# Patient Record
Sex: Female | Born: 1985 | Race: Asian | Hispanic: No | Marital: Married | Smoking: Never smoker
Health system: Southern US, Community
[De-identification: ages and names within clinical notes are randomized; demographics above are authoritative.]

## PROBLEM LIST (undated history)

## (undated) DIAGNOSIS — Z789 Other specified health status: Secondary | ICD-10-CM

## (undated) DIAGNOSIS — E079 Disorder of thyroid, unspecified: Secondary | ICD-10-CM

## (undated) DIAGNOSIS — G43109 Migraine with aura, not intractable, without status migrainosus: Secondary | ICD-10-CM

## (undated) DIAGNOSIS — E669 Obesity, unspecified: Secondary | ICD-10-CM

## (undated) HISTORY — DX: Other specified health status: Z78.9

## (undated) HISTORY — DX: Migraine with aura, not intractable, without status migrainosus: G43.109

## (undated) HISTORY — DX: Obesity, unspecified: E66.9

---

## 2015-11-02 DIAGNOSIS — E049 Nontoxic goiter, unspecified: Secondary | ICD-10-CM | POA: Insufficient documentation

## 2015-11-02 DIAGNOSIS — N941 Unspecified dyspareunia: Secondary | ICD-10-CM | POA: Insufficient documentation

## 2015-11-02 DIAGNOSIS — E040 Nontoxic diffuse goiter: Secondary | ICD-10-CM | POA: Insufficient documentation

## 2015-11-02 LAB — HM PAP SMEAR

## 2017-08-10 ENCOUNTER — Ambulatory Visit (INDEPENDENT_AMBULATORY_CARE_PROVIDER_SITE_OTHER): Payer: Managed Care, Other (non HMO) | Admitting: Maternal Newborn

## 2017-08-10 ENCOUNTER — Encounter: Payer: Self-pay | Admitting: Maternal Newborn

## 2017-08-10 VITALS — BP 100/62 | Ht 64.0 in | Wt 194.0 lb

## 2017-08-10 DIAGNOSIS — O0990 Supervision of high risk pregnancy, unspecified, unspecified trimester: Secondary | ICD-10-CM | POA: Insufficient documentation

## 2017-08-10 DIAGNOSIS — E039 Hypothyroidism, unspecified: Secondary | ICD-10-CM | POA: Insufficient documentation

## 2017-08-10 DIAGNOSIS — O0991 Supervision of high risk pregnancy, unspecified, first trimester: Secondary | ICD-10-CM

## 2017-08-10 DIAGNOSIS — E669 Obesity, unspecified: Secondary | ICD-10-CM | POA: Insufficient documentation

## 2017-08-10 DIAGNOSIS — G43009 Migraine without aura, not intractable, without status migrainosus: Secondary | ICD-10-CM

## 2017-08-10 DIAGNOSIS — G43109 Migraine with aura, not intractable, without status migrainosus: Secondary | ICD-10-CM | POA: Insufficient documentation

## 2017-08-10 NOTE — Progress Notes (Signed)
08/10/2017   Chief Complaint: Positive home pregnancy test, desires prenatal care.  Transfer of Care Patient: no  History of Present Illness: Ms. Zettlemoyer is a 31 y.o. G1P0 with an EDD of 03/14/2018 based on Patient's last menstrual period was 06/07/2017 (exact date) with the above CC.   Her periods were: regular periods every 32-33 days, lasting 4 days. She was using no method when she conceived.  She has Positive signs or symptoms of nausea of pregnancy. She has Negative signs or symptoms of miscarriage or preterm labor She identifies Negative Zika risk factors for her and her partner On any different medications around the time she conceived/early pregnancy: No  History of varicella: Yes   ROS: A 12-point review of systems was performed and negative, except as stated in the above HPI.  OBGYN History: As per HPI. OB History  Gravida Para Term Preterm AB Living  1            SAB TAB Ectopic Multiple Live Births               # Outcome Date GA Lbr Len/2nd Weight Sex Delivery Anes PTL Lv  1 Current              Any issues with any prior pregnancies: not applicable. Any prior children are healthy, doing well, without any problems or issues: not applicable. History of pap smears: Yes. Last pap smear 11/02/2015, NILM/HPV Negative.  History of STIs: No.   Past Medical History: No past medical history on file.  Past Surgical History: No past surgical history on file.  Family History:  Family History  Problem Relation Age of Onset  . Leukemia Maternal Grandfather    She denies any female cancers, bleeding or blood clotting disorders.  She denies any history of intellectual disability, birth defects or genetic disorders in her or the FOB's history  Social History:  Social History   Social History  . Marital status: Married    Spouse name: N/A  . Number of children: N/A  . Years of education: N/A   Occupational History  . Not on file.   Social History Main Topics  .  Smoking status: Never Smoker  . Smokeless tobacco: Never Used  . Alcohol use No  . Drug use: No  . Sexual activity: Yes    Birth control/ protection: None   Other Topics Concern  . Not on file   Social History Narrative  . No narrative on file   Any cats in the household: no. Denies exposure to TB.  Allergy: No Known Allergies  Current Outpatient Medications: No current outpatient prescriptions on file.  .. Physical Exam:   BP 100/62   Ht  (1.626 m)   Wt 194 lb (88 kg)   LMP 06/07/2017 (Exact Date)   BMI 33.30 kg/m  Body mass index is 33.3 kg/m. Constitutional: Well nourished, well developed female in no acute distress.  Neck:  Supple, normal appearance, and no thyromegaly  Cardiovascular: S1, S2 normal, no murmur, rub or gallop, regular rate and rhythm Respiratory:  Clear to auscultation bilateral. Normal respiratory effort Abdomen: positive bowel sounds and no masses, hernias; diffusely non tender to palpation, non distended Breasts: breasts appear normal, no suspicious masses, no skin or nipple changes or axillary nodes. Neuro/Psych:  Normal mood and affect.  Skin:  Warm and dry.  Lymphatic:  No inguinal lymphadenopathy.   Pelvic exam: is not limited by body habitus, but patient experiences pelvic exam as very  painful and difficult to tolerate. External genitalia, Bartholin's glands, Urethra, Skene's glands: within normal limits Vagina: within normal limits and with no blood in the vault  Cervix: normal appearing cervix without discharge or lesions, closed/long/high Uterus:  enlarged: consistent with pregnancy Adnexa:  normal adnexa  Assessment: Ms. Leblond is a 31 y.o. G1P0 Unknown based on Patient's last menstrual period was 06/07/2017 (exact date). with an Estimated Date of Delivery: None noted., presenting for prenatal care.  Plan:  1) Avoid alcoholic beverages. 2) Patient encouraged not to smoke.  3) Discontinue the use of all non-medicinal drugs  and chemicals.  4) Take prenatal vitamins daily.  5) Seatbelt use advised. 6) Nutrition, food safety (fish, cheese advisories, and high nitrite foods) and exercise discussed. 7) Hospital and practice style delivering at [facility] discussed.  8) Patient is asked about travel to areas at risk for the Zika virus, and counseled to avoid travel and exposure to mosquitoes or sexual partners who may have themselves been exposed to the virus.  9) Childbirth classes at [facility] advised. 10) Genetic Screening, such as with 1st Trimester Screening, cell free fetal DNA, AFP testing, and Ultrasound, as well as with amniocentesis and CVS as appropriate, is discussed with patient. She is undecided about genetic testing this pregnancy.  Needs GC next visit.  Problem list reviewed and updated.  Marcelyn Bruins, CNM [facility], [facility] 08/10/2017  11:48 AM

## 2017-08-10 NOTE — Patient Instructions (Signed)
First Trimester of Pregnancy The first trimester of pregnancy is from week 1 until the end of week 13 (months 1 through 3). A week after a sperm fertilizes an egg, the egg will implant on the wall of the uterus. This embryo will begin to develop into a baby. Genes from you and your partner will form the baby. The female genes will determine whether the baby will be a boy or a girl. At 6-8 weeks, the eyes and face will be formed, and the heartbeat can be seen on ultrasound. At the end of 12 weeks, all the baby's organs will be formed. Now that you are pregnant, you will want to do everything you can to have a healthy baby. Two of the most important things are to get good prenatal care and to follow your health care provider's instructions. Prenatal care is all the medical care you receive before the baby's birth. This care will help prevent, find, and treat any problems during the pregnancy and childbirth. Body changes during your first trimester Your body goes through many changes during pregnancy. The changes vary from woman to woman.  You may gain or lose a couple of pounds at first.  You may feel sick to your stomach (nauseous) and you may throw up (vomit). If the vomiting is uncontrollable, call your health care provider.  You may tire easily.  You may develop headaches that can be relieved by medicines. All medicines should be approved by your health care provider.  You may urinate more often. Painful urination may mean you have a bladder infection.  You may develop heartburn as a result of your pregnancy.  You may develop constipation because certain hormones are causing the muscles that push stool through your intestines to slow down.  You may develop hemorrhoids or swollen veins (varicose veins).  Your breasts may begin to grow larger and become tender. Your nipples may stick out more, and the tissue that surrounds them (areola) may become darker.  Your gums may bleed and may be  sensitive to brushing and flossing.  Dark spots or blotches (chloasma, mask of pregnancy) may develop on your face. This will likely fade after the baby is born.  Your menstrual periods will stop.  You may have a loss of appetite.  You may develop cravings for certain kinds of food.  You may have changes in your emotions from day to day, such as being excited to be pregnant or being concerned that something may go wrong with the pregnancy and baby.  You may have more vivid and strange dreams.  You may have changes in your hair. These can include thickening of your hair, rapid growth, and changes in texture. Some women also have hair loss during or after pregnancy, or hair that feels dry or thin. Your hair will most likely return to normal after your baby is born.  What to expect at prenatal visits During a routine prenatal visit:  You will be weighed to make sure you and the baby are growing normally.  Your blood pressure will be taken.  Your abdomen will be measured to track your baby's growth.  The fetal heartbeat will be listened to between weeks 10 and 14 of your pregnancy.  Test results from any previous visits will be discussed.  Your health care provider may ask you:  How you are feeling.  If you are feeling the baby move.  If you have had any abnormal symptoms, such as leaking fluid, bleeding, severe headaches,   or abdominal cramping.  If you are using any tobacco products, including cigarettes, chewing tobacco, and electronic cigarettes.  If you have any questions.  Other tests that may be performed during your first trimester include:  Blood tests to find your blood type and to check for the presence of any previous infections. The tests will also be used to check for low iron levels (anemia) and protein on red blood cells (Rh antibodies). Depending on your risk factors, or if you previously had diabetes during pregnancy, you may have tests to check for high blood  sugar that affects pregnant women (gestational diabetes).  Urine tests to check for infections, diabetes, or protein in the urine.  An ultrasound to confirm the proper growth and development of the baby.  Fetal screens for spinal cord problems (spina bifida) and Down syndrome.  HIV (human immunodeficiency virus) testing. Routine prenatal testing includes screening for HIV, unless you choose not to have this test.  You may need other tests to make sure you and the baby are doing well.  Follow these instructions at home: Medicines  Follow your health care provider's instructions regarding medicine use. Specific medicines may be either safe or unsafe to take during pregnancy.  Take a prenatal vitamin that contains at least 600 micrograms (mcg) of folic acid.  If you develop constipation, try taking a stool softener if your health care provider approves. Eating and drinking  Eat a balanced diet that includes fresh fruits and vegetables, whole grains, good sources of protein such as meat, eggs, or tofu, and low-fat dairy. Your health care provider will help you determine the amount of weight gain that is right for you.  Avoid raw meat and uncooked cheese. These carry germs that can cause birth defects in the baby.  Eating four or five small meals rather than three large meals a day may help relieve nausea and vomiting. If you start to feel nauseous, eating a few soda crackers can be helpful. Drinking liquids between meals, instead of during meals, also seems to help ease nausea and vomiting.  Limit foods that are high in fat and processed sugars, such as fried and sweet foods.  To prevent constipation: ? Eat foods that are high in fiber, such as fresh fruits and vegetables, whole grains, and beans. ? Drink enough fluid to keep your urine clear or pale yellow. Activity  Exercise only as directed by your health care provider. Most women can continue their usual exercise routine during  pregnancy. Try to exercise for 30 minutes at least 5 days a week. Exercising will help you: ? Control your weight. ? Stay in shape. ? Be prepared for labor and delivery.  Experiencing pain or cramping in the lower abdomen or lower back is a good sign that you should stop exercising. Check with your health care provider before continuing with normal exercises.  Try to avoid standing for long periods of time. Move your legs often if you must stand in one place for a long time.  Avoid heavy lifting.  Wear low-heeled shoes and practice good posture.  You may continue to have sex unless your health care provider tells you not to. Relieving pain and discomfort  Wear a good support bra to relieve breast tenderness.  Take warm sitz baths to soothe any pain or discomfort caused by hemorrhoids. Use hemorrhoid cream if your health care provider approves.  Rest with your legs elevated if you have leg cramps or low back pain.  If you develop   varicose veins in your legs, wear support hose. Elevate your feet for 15 minutes, 3-4 times a day. Limit salt in your diet. Prenatal care  Schedule your prenatal visits by the twelfth week of pregnancy. They are usually scheduled monthly at first, then more often in the last 2 months before delivery.  Write down your questions. Take them to your prenatal visits.  Keep all your prenatal visits as told by your health care provider. This is important. Safety  Wear your seat belt at all times when driving.  Make a list of emergency phone numbers, including numbers for family, friends, the hospital, and police and fire departments. General instructions  Ask your health care provider for a referral to a local prenatal education class. Begin classes no later than the beginning of month 6 of your pregnancy.  Ask for help if you have counseling or nutritional needs during pregnancy. Your health care provider can offer advice or refer you to specialists for help  with various needs.  Do not use hot tubs, steam rooms, or saunas.  Do not douche or use tampons or scented sanitary pads.  Do not cross your legs for long periods of time.  Avoid cat litter boxes and soil used by cats. These carry germs that can cause birth defects in the baby and possibly loss of the fetus by miscarriage or stillbirth.  Avoid all smoking, herbs, alcohol, and medicines not prescribed by your health care provider. Chemicals in these products affect the formation and growth of the baby.  Do not use any products that contain nicotine or tobacco, such as cigarettes and e-cigarettes. If you need help quitting, ask your health care provider. You may receive counseling support and other resources to help you quit.  Schedule a dentist appointment. At home, brush your teeth with a soft toothbrush and be gentle when you floss. Contact a health care provider if:  You have dizziness.  You have mild pelvic cramps, pelvic pressure, or nagging pain in the abdominal area.  You have persistent nausea, vomiting, or diarrhea.  You have a bad smelling vaginal discharge.  You have pain when you urinate.  You notice increased swelling in your face, hands, legs, or ankles.  You are exposed to fifth disease or chickenpox.  You are exposed to German measles (rubella) and have never had it. Get help right away if:  You have a fever.  You are leaking fluid from your vagina.  You have spotting or bleeding from your vagina.  You have severe abdominal cramping or pain.  You have rapid weight gain or loss.  You vomit blood or material that looks like coffee grounds.  You develop a severe headache.  You have shortness of breath.  You have any kind of trauma, such as from a fall or a car accident. Summary  The first trimester of pregnancy is from week 1 until the end of week 13 (months 1 through 3).  Your body goes through many changes during pregnancy. The changes vary from  woman to woman.  You will have routine prenatal visits. During those visits, your health care provider will examine you, discuss any test results you may have, and talk with you about how you are feeling. This information is not intended to replace advice given to you by your health care provider. Make sure you discuss any questions you have with your health care provider. Document Released: 10/24/2001 Document Revised: 10/11/2016 Document Reviewed: 10/11/2016 Elsevier Interactive Patient Education  2017 Elsevier   Inc.  

## 2017-08-11 LAB — RPR+RH+ABO+RUB AB+AB SCR+CB...
Antibody Screen: NEGATIVE
HEMATOCRIT: 37.4 % (ref 34.0–46.6)
HEP B S AG: NEGATIVE
HIV SCREEN 4TH GENERATION: NONREACTIVE
Hemoglobin: 12.2 g/dL (ref 11.1–15.9)
MCH: 27.2 pg (ref 26.6–33.0)
MCHC: 32.6 g/dL (ref 31.5–35.7)
MCV: 84 fL (ref 79–97)
PLATELETS: 329 10*3/uL (ref 150–379)
RBC: 4.48 x10E6/uL (ref 3.77–5.28)
RDW: 14.1 % (ref 12.3–15.4)
RH TYPE: POSITIVE
RPR: NONREACTIVE
RUBELLA: 17.9 {index} (ref >0.99)
Varicella zoster IgG: 1114 index (ref >165)
WBC: 8.1 10*3/uL (ref 3.4–10.8)

## 2017-08-12 LAB — URINE DRUG PANEL 7
Amphetamines, Urine: NEGATIVE ng/mL
Barbiturate Quant, Ur: NEGATIVE ng/mL
Benzodiazepine Quant, Ur: NEGATIVE ng/mL
CANNABINOID QUANT UR: NEGATIVE ng/mL
COCAINE (METAB.): NEGATIVE ng/mL
OPIATE QUANT UR: NEGATIVE ng/mL
PCP Quant, Ur: NEGATIVE ng/mL

## 2017-08-12 LAB — URINE CULTURE

## 2017-08-14 ENCOUNTER — Encounter: Payer: Self-pay | Admitting: Maternal Newborn

## 2017-08-14 DIAGNOSIS — E559 Vitamin D deficiency, unspecified: Secondary | ICD-10-CM | POA: Insufficient documentation

## 2017-08-14 DIAGNOSIS — D649 Anemia, unspecified: Secondary | ICD-10-CM | POA: Insufficient documentation

## 2017-08-21 ENCOUNTER — Ambulatory Visit (INDEPENDENT_AMBULATORY_CARE_PROVIDER_SITE_OTHER): Payer: Managed Care, Other (non HMO)

## 2017-08-21 ENCOUNTER — Ambulatory Visit (INDEPENDENT_AMBULATORY_CARE_PROVIDER_SITE_OTHER): Payer: Managed Care, Other (non HMO) | Admitting: Obstetrics & Gynecology

## 2017-08-21 VITALS — BP 100/70 | Wt 193.0 lb

## 2017-08-21 DIAGNOSIS — Z362 Encounter for other antenatal screening follow-up: Secondary | ICD-10-CM

## 2017-08-21 DIAGNOSIS — O0991 Supervision of high risk pregnancy, unspecified, first trimester: Secondary | ICD-10-CM

## 2017-08-21 DIAGNOSIS — Z3A10 10 weeks gestation of pregnancy: Secondary | ICD-10-CM

## 2017-08-21 DIAGNOSIS — E669 Obesity, unspecified: Secondary | ICD-10-CM

## 2017-08-21 NOTE — Progress Notes (Signed)
Review of ULTRASOUND.    I have personally reviewed images and report of recent ultrasound done at [facility].    Plan of management to be discussed with patient. EDC remains the same. Genetic testing discussed, undecided. GC of urine today or nv if unable to procure specimen

## 2017-08-22 ENCOUNTER — Encounter: Payer: Self-pay | Admitting: Obstetrics & Gynecology

## 2017-08-29 ENCOUNTER — Other Ambulatory Visit: Payer: Self-pay | Admitting: Obstetrics & Gynecology

## 2017-08-29 DIAGNOSIS — Q909 Down syndrome, unspecified: Secondary | ICD-10-CM

## 2017-08-29 DIAGNOSIS — Z1371 Encounter for nonprocreative screening for genetic disease carrier status: Secondary | ICD-10-CM

## 2017-08-29 NOTE — Telephone Encounter (Signed)
Order in for First screen ultrasound, pt needs to have scheduled soon, with visit CNM same day w labs

## 2017-09-03 ENCOUNTER — Telehealth: Payer: Self-pay | Admitting: Obstetrics & Gynecology

## 2017-09-03 NOTE — Telephone Encounter (Signed)
Called and left voicemail for patient to schedule appt.

## 2017-09-03 NOTE — Telephone Encounter (Signed)
Shelly Coleman, Shelly P, MD  to Ws-Admin     10:51 AM  Note    Order in for First screen ultrasound, pt needs to have scheduled soon, with visit CNM same day w labs    Shelly Coleman, Shelly P, MD  to Shelly Coleman Lannen       10:51 AM  Appointment would entail an Ultrasound followed by lab work. Maybe an hour to hour and a half... Depends on how cooperative the baby is on ultrasound and hopefully no delays in the office.    Last read by Shelly Coleman Rua at 1:41 PM on 08/31/2017.        8:19 AM  Shelly Coleman, Shelly Coleman, CMA routed this conversation to Shelly Coleman, Shelly P, MD  August 28, 2017  Shelly Coleman Hallisey  to Shelly Mustardobert Coleman Harris, MD       3:29 PM  Thanks a lot for the response.... can you please tell me how much time will the whole process take, so that I can schedule it around my work schedule

## 2017-09-03 NOTE — Telephone Encounter (Signed)
Spoke with patient. Pt will send her husband to schedule appt

## 2017-09-10 ENCOUNTER — Encounter: Payer: Managed Care, Other (non HMO) | Admitting: Advanced Practice Midwife

## 2017-09-11 ENCOUNTER — Ambulatory Visit (INDEPENDENT_AMBULATORY_CARE_PROVIDER_SITE_OTHER): Payer: Managed Care, Other (non HMO)

## 2017-09-11 ENCOUNTER — Ambulatory Visit (INDEPENDENT_AMBULATORY_CARE_PROVIDER_SITE_OTHER): Payer: Managed Care, Other (non HMO) | Admitting: Advanced Practice Midwife

## 2017-09-11 VITALS — BP 120/80 | Wt 197.0 lb

## 2017-09-11 DIAGNOSIS — Z1371 Encounter for nonprocreative screening for genetic disease carrier status: Secondary | ICD-10-CM

## 2017-09-11 DIAGNOSIS — O0991 Supervision of high risk pregnancy, unspecified, first trimester: Secondary | ICD-10-CM

## 2017-09-11 DIAGNOSIS — Z3A13 13 weeks gestation of pregnancy: Secondary | ICD-10-CM

## 2017-09-11 DIAGNOSIS — Z113 Encounter for screening for infections with a predominantly sexual mode of transmission: Secondary | ICD-10-CM

## 2017-09-11 DIAGNOSIS — Z1379 Encounter for other screening for genetic and chromosomal anomalies: Secondary | ICD-10-CM

## 2017-09-11 NOTE — Patient Instructions (Signed)

## 2017-09-11 NOTE — Progress Notes (Signed)
  Routine Prenatal Care Visit  Subjective  Shelly Coleman is a 31 y.o. G1P0 at 8123w5d being seen today for ongoing prenatal care.  She is currently monitored for the following issues for this high-risk pregnancy and has Acquired hypothyroidism; Migraine with aura and without status migrainosus, not intractable; Obesity (BMI 30-39.9); Supervision of high risk pregnancy, antepartum, first trimester; Vitamin D deficiency, unspecified; Goiter diffuse; Dyspareunia, female; and Chronic anemia on her problem list.  ----------------------------------------------------------------------------------- Patient reports external genitalia bumps possibly ingrown hairs.   Denies leaking of fluid. Denies vaginal bleeding. Denies cramping. ----------------------------------------------------------------------------------- The following portions of the patient's history were reviewed and updated as appropriate: allergies, current medications, past family history, past medical history, past social history, past surgical history and problem list. Problem list updated.   Objective  Blood pressure 120/80, weight 197 lb (89.4 kg), last menstrual period 06/07/2017. Pregravid weight 190 lb (86.2 kg) Total Weight Gain 7 lb (3.175 kg) Urinalysis:      Fetal Status: NT scan/1st trimester screen today: NT 2.8 mm, CRL 81.0 mm, Nasal bone present  General:   Alert, oriented and cooperative. Patient is in no acute distress.  Skin: Skin is warm and dry. No rash noted.   Cardiovascular: Normal heart rate noted  Respiratory: Normal respiratory effort, no problems with respiration noted  Abdomen: Soft, gravid, appropriate for gestational age.       Pelvic:  Cervical exam deferred        Extremities: Normal range of motion.     Mental Status: Normal mood and affect. Normal behavior. Normal judgment and thought content.   Assessment   31 y.o. G1P0 at 323w5d by  03/14/2018, by Last Menstrual Period presenting for routine prenatal  visit  Plan   Pregnancy #1 Problems (from 08/10/17 to present)    Problem Noted Resolved   Supervision of high risk pregnancy, antepartum, first trimester 08/10/2017 by Oswaldo ConroySchmid, Jacelyn Y, CNM No   Overview Signed 08/10/2017  9:44 AM by Oswaldo ConroySchmid, Jacelyn Y, CNM    Clinic [facility] Prenatal Labs  Dating  Blood type:     Genetic Screen 1 Screen:    AFP:     Quad:     NIPS: Antibody:   Anatomic US  Rubella:   Varicella:    GTT Early:               Third trimester:  RPR:     Rhogam  HBsAg:     TDaP vaccine                       Flu Shot: HIV:     Baby Food                                GBS:   Contraception  Pap:  CBB     CS/VBAC NA   Support Person                  Preterm labor symptoms and general obstetric precautions including but not limited to vaginal bleeding, contractions, leaking of fluid and fetal movement were reviewed in detail with the patient. Please refer to After Visit Summary for other counseling recommendations.   Return in about 4 weeks (around 10/09/2017) for rob.  Tresea MallJane Tamaya Pun, CNM  09/11/2017 4:10 PM

## 2017-09-12 ENCOUNTER — Other Ambulatory Visit: Payer: Managed Care, Other (non HMO)

## 2017-09-12 ENCOUNTER — Encounter: Payer: Managed Care, Other (non HMO) | Admitting: Advanced Practice Midwife

## 2017-09-13 LAB — GC/CHLAMYDIA PROBE AMP
CHLAMYDIA, DNA PROBE: NEGATIVE
NEISSERIA GONORRHOEAE BY PCR: NEGATIVE

## 2017-09-13 LAB — FIRST TRIMESTER SCREEN W/NT
CRL: 81 mm
DIA MOM: 1.99
DIA Value: 356.5 pg/mL
GEST AGE-COLLECT: 13.4 wk
MATERNAL AGE AT EDD: 33 a
NUCHAL TRANSLUCENCY: 2.8 mm
NUMBER OF FETUSES: 1
Nuchal Translucency MoM: 1.42
PAPP-A MoM: 0.85
PAPP-A Value: 808.6 ng/mL
TEST RESULTS: POSITIVE — AB
Weight: 197 [lb_av]
hCG MoM: 1.79
hCG Value: 113.5 IU/mL

## 2017-09-17 ENCOUNTER — Telehealth: Payer: Self-pay

## 2017-09-17 NOTE — Telephone Encounter (Signed)
[facility] to be sure we had the abnl first trimester screen on pt.  [phone].  Called and adv we did have results.

## 2017-09-18 ENCOUNTER — Other Ambulatory Visit: Payer: Self-pay | Admitting: Advanced Practice Midwife

## 2017-09-18 ENCOUNTER — Encounter: Payer: Self-pay | Admitting: Advanced Practice Midwife

## 2017-09-18 DIAGNOSIS — O285 Abnormal chromosomal and genetic finding on antenatal screening of mother: Secondary | ICD-10-CM

## 2017-09-19 ENCOUNTER — Encounter: Payer: Self-pay | Admitting: Obstetrics & Gynecology

## 2017-09-19 ENCOUNTER — Encounter: Payer: Managed Care, Other (non HMO) | Admitting: Obstetrics and Gynecology

## 2017-09-19 ENCOUNTER — Telehealth: Payer: Self-pay | Admitting: Advanced Practice Midwife

## 2017-09-19 ENCOUNTER — Other Ambulatory Visit: Payer: Managed Care, Other (non HMO)

## 2017-09-19 DIAGNOSIS — O285 Abnormal chromosomal and genetic finding on antenatal screening of mother: Secondary | ICD-10-CM

## 2017-09-19 NOTE — Telephone Encounter (Signed)
Patient and husband would like call with genetic testing results.  Shelly Coleman had called and left generic msg and they are wanting to talk to someone who could explain results and answer any questions.  Husband would like the cb, he is on privacy policy.  CB [phone].

## 2017-09-20 NOTE — Telephone Encounter (Signed)
Spoke with patient's husband. He says they want to find out gender together. I will write a note and leave it at the front desk for them when the results come back.

## 2017-09-25 LAB — INFORMASEQ(SM) WITH XY ANALYSIS
FETAL FRACTION (%): 6.3
GESTATIONAL AGE AT COLLECTION: 14.9 wk
Weight: 201 [lb_av]

## 2017-09-26 ENCOUNTER — Encounter: Payer: Self-pay | Admitting: Advanced Practice Midwife

## 2017-09-26 NOTE — Telephone Encounter (Signed)
Please advise 

## 2017-10-09 ENCOUNTER — Ambulatory Visit (INDEPENDENT_AMBULATORY_CARE_PROVIDER_SITE_OTHER): Payer: Managed Care, Other (non HMO) | Admitting: Obstetrics and Gynecology

## 2017-10-09 VITALS — BP 104/64 | Wt 200.0 lb

## 2017-10-09 DIAGNOSIS — Z23 Encounter for immunization: Secondary | ICD-10-CM | POA: Diagnosis not present

## 2017-10-09 DIAGNOSIS — O0990 Supervision of high risk pregnancy, unspecified, unspecified trimester: Secondary | ICD-10-CM

## 2017-10-09 DIAGNOSIS — O0992 Supervision of high risk pregnancy, unspecified, second trimester: Secondary | ICD-10-CM

## 2017-10-09 DIAGNOSIS — Z3A17 17 weeks gestation of pregnancy: Secondary | ICD-10-CM

## 2017-10-09 DIAGNOSIS — E039 Hypothyroidism, unspecified: Secondary | ICD-10-CM

## 2017-10-09 NOTE — Progress Notes (Signed)
Pt reports no problems.   

## 2017-10-09 NOTE — Progress Notes (Signed)
     Routine Prenatal Care Visit  Subjective  Shelly Coleman is a 31 y.o. G1P0 at 3832w6d being seen today for ongoing prenatal care.  She is currently monitored for the following issues for this high-risk pregnancy and has Acquired hypothyroidism; Migraine with aura and without status migrainosus, not intractable; Obesity (BMI 30-39.9); Supervision of high risk pregnancy, antepartum; Vitamin D deficiency, unspecified; Goiter diffuse; Dyspareunia, female; and Chronic anemia on their problem list.  ----------------------------------------------------------------------------------- Patient reports no complaints.    Contractions: Not present. Vag. Bleeding: None.  Movement: Absent. Denies leaking of fluid.     Objective   Vitals:   10/09/17 0836  BP: 104/64  Weight: 200 lb (90.7 kg)   Pregravid Weight: 190 lb (86.2 kg)  Total Weight Gain: 10 lb (4.536 kg)  Urinalysis: Urine Protein: Negative Urine Glucose: Negative  Fetal Status: Fetal Heart Rate (bpm): 145   Movement: Absent     General: Alert: Oriented and cooperative. Patient is in no acute distress. Skin: Skin is warm and dry. No rash noted.  Cardiovascular: Regular rate and rhythm. No murmurs, gallops, or rubs. Respiratory: Normal respiratory effort, no problems with respiration noted Abdomen: Soft, gravid, appropriate for gestational age. Pain/Pressure: Absent Pelvic: Cervical exam deferred       Extremeties: Normal range of motion.    Mental Status: Normal mood and affect. Normal behavior. Normal judgment and thought content.  Assessment   31 y.o. G1P0 at 5632w6d by  03/14/2018, by Last Menstrual Period presenting for routine prenatal visit  Plan   Pregnancy #1 Problems (from 08/10/17 to present)    Problem Noted Resolved   Supervision of high risk pregnancy, antepartum 08/10/2017 by Oswaldo ConroySchmid, Jacelyn Y, CNM No   Overview Addendum 10/09/2017 10:32 AM by Natale MilchSchuman, Gilmore List R, MD      Clinic [facility] Prenatal [facility]  Dating   LMP=T1US Blood type: O/Positive/-- (09/28 1001)   Genetic Screen NIPS:   Normal XY female Antibody:Negative (09/28 1001)  Anatomic US  Rubella: 17.90 (09/28 1001) Varicella: Immune  GTT Early:        28 wk:      RPR: Non Reactive (09/28 1001)   Rhogam  Non indicated HBsAg: Negative (09/28 1001)   TDaP vaccine                       HIV:   Negative (08/10/17)  Flu Shot   10/09/2017                            GBS:   Contraception  Pap:  CBB     CS/VBAC    Baby Food    Support Person                 Preterm labor symptoms and general obstetric precautions including but not limited to vaginal bleeding, contractions, leaking of fluid and fetal movement were reviewed in detail with the patient.  Hypothyroidism being managed by her endocrinologist. She is having monthly labs done.  Flu Shot today.   Return in about 2 weeks (around 10/23/2017) for Anatomy US and ROB.  Adelene Idlerhristanna Danniell Rotundo M.D. 10/10/2017

## 2017-10-17 ENCOUNTER — Encounter: Payer: Self-pay | Admitting: Advanced Practice Midwife

## 2017-10-23 ENCOUNTER — Ambulatory Visit (INDEPENDENT_AMBULATORY_CARE_PROVIDER_SITE_OTHER): Payer: Managed Care, Other (non HMO) | Admitting: Certified Nurse Midwife

## 2017-10-23 ENCOUNTER — Ambulatory Visit (INDEPENDENT_AMBULATORY_CARE_PROVIDER_SITE_OTHER): Payer: Managed Care, Other (non HMO)

## 2017-10-23 VITALS — BP 110/70 | Wt 202.0 lb

## 2017-10-23 DIAGNOSIS — O285 Abnormal chromosomal and genetic finding on antenatal screening of mother: Secondary | ICD-10-CM | POA: Insufficient documentation

## 2017-10-23 DIAGNOSIS — O0990 Supervision of high risk pregnancy, unspecified, unspecified trimester: Secondary | ICD-10-CM

## 2017-10-23 DIAGNOSIS — Z3A19 19 weeks gestation of pregnancy: Secondary | ICD-10-CM

## 2017-10-23 DIAGNOSIS — O0992 Supervision of high risk pregnancy, unspecified, second trimester: Secondary | ICD-10-CM | POA: Diagnosis not present

## 2017-10-23 DIAGNOSIS — Z789 Other specified health status: Secondary | ICD-10-CM | POA: Insufficient documentation

## 2017-10-23 NOTE — Progress Notes (Signed)
Pt c/o head cold since Friday 12/7, taking claritin. Temp today 97.7. Anatomy scan today.

## 2017-10-23 NOTE — Progress Notes (Signed)
ROB and anatomy scan at 19wk5d.  Anatomy scan normal but incomplete for profile, right outflow tract and profile Anterior placenta, +quickening Has cold sx-taking Claritin with relief of rhinorrhea. Safe OTC meds discussed for cold sx and heartburn Some momentary SOB-probably normal for pregnancy, no CP or wheezing. Lungs CTAB Multiple questions answered Has questions regarding coding on NIPT-coding was incorrect-will ask Felicia to look into this Had a positive screen for Down syndrome with first trimester test , but negative Informaseq ROB and FU anatomy scan in 4 weeks

## 2017-11-07 ENCOUNTER — Telehealth: Payer: Self-pay | Admitting: Certified Nurse Midwife

## 2017-11-07 NOTE — Telephone Encounter (Signed)
The dx I was given by Jill Sideolleen, or that I understood should have been used, is O28.5.  [facility] states that that is the code that was used and billed.  Is that code correct?

## 2017-11-08 NOTE — Telephone Encounter (Signed)
Had the right code, but this test was diagnostic and her part of the bill went to her deductible.

## 2017-11-08 NOTE — Telephone Encounter (Signed)
This problem is resolved

## 2017-11-28 ENCOUNTER — Ambulatory Visit (INDEPENDENT_AMBULATORY_CARE_PROVIDER_SITE_OTHER): Payer: Managed Care, Other (non HMO) | Admitting: Certified Nurse Midwife

## 2017-11-28 ENCOUNTER — Ambulatory Visit (INDEPENDENT_AMBULATORY_CARE_PROVIDER_SITE_OTHER): Payer: Managed Care, Other (non HMO)

## 2017-11-28 VITALS — BP 100/60 | Wt 206.0 lb

## 2017-11-28 DIAGNOSIS — Z3A24 24 weeks gestation of pregnancy: Secondary | ICD-10-CM

## 2017-11-28 DIAGNOSIS — Z131 Encounter for screening for diabetes mellitus: Secondary | ICD-10-CM

## 2017-11-28 DIAGNOSIS — O0990 Supervision of high risk pregnancy, unspecified, unspecified trimester: Secondary | ICD-10-CM

## 2017-11-28 DIAGNOSIS — Z362 Encounter for other antenatal screening follow-up: Secondary | ICD-10-CM

## 2017-11-28 NOTE — Progress Notes (Signed)
ROB and FU anatomy scan at 24wk6days Normal scan and normal growth today (52nd%), baby breech Feeling fetal movement. Has noticed more acanthosis nigricans around neck, more bilateral leg cramping at night. HAs been having some pain in her right thoracic back area after some supper meals x 2 mos,, sometimes 2-3 x/week. No nausea with pain, and it feels better with massage. No past history of gall bladder problems. Will get 1 hour Gtt in 3 weeks and ROB. Recommend Caltrate 600 Plus (with magnesium) at bedtime DIscussed available prenatal classes.  Shelly Coleman, CNM

## 2017-11-28 NOTE — Progress Notes (Signed)
Pt c/o leg cramps at night and pain below right shoulder in back after eating dinner. F/u u/s today for completion anatomy.

## 2017-12-19 ENCOUNTER — Other Ambulatory Visit: Payer: Managed Care, Other (non HMO)

## 2017-12-19 ENCOUNTER — Ambulatory Visit (INDEPENDENT_AMBULATORY_CARE_PROVIDER_SITE_OTHER): Payer: Managed Care, Other (non HMO) | Admitting: Certified Nurse Midwife

## 2017-12-19 VITALS — BP 108/64 | Wt 211.0 lb

## 2017-12-19 DIAGNOSIS — O0990 Supervision of high risk pregnancy, unspecified, unspecified trimester: Secondary | ICD-10-CM

## 2017-12-19 DIAGNOSIS — Z3A27 27 weeks gestation of pregnancy: Secondary | ICD-10-CM

## 2017-12-19 DIAGNOSIS — Z3A24 24 weeks gestation of pregnancy: Secondary | ICD-10-CM

## 2017-12-19 DIAGNOSIS — Z131 Encounter for screening for diabetes mellitus: Secondary | ICD-10-CM

## 2017-12-19 NOTE — Progress Notes (Signed)
Pt started taking magnesium for leg cramps at night and states it was helping for awhile but they have returned. 28 wk labs today.

## 2017-12-19 NOTE — Progress Notes (Signed)
ROB and 28 week labs at 27wk6d: Feeling good FM. Taking magnesium 250 mgm at night for leg cramps which has been helping prevent leg cramps. Having some pain over her pubic bone with some movement.   Signed up for prenatal classes Exam: no evidence of VV or inflammation in calves. Can use compression hose if desires. FH 30 and FHTs WNL ROB in 2 weeks.

## 2017-12-20 LAB — 28 WEEK RH+PANEL
BASOS ABS: 0 10*3/uL (ref 0.0–0.2)
Basos: 0 %
EOS (ABSOLUTE): 0.1 10*3/uL (ref 0.0–0.4)
Eos: 1 %
Gestational Diabetes Screen: 132 mg/dL (ref 65–139)
HEMATOCRIT: 33.7 % — AB (ref 34.0–46.6)
HEMOGLOBIN: 10.9 g/dL — AB (ref 11.1–15.9)
HIV Screen 4th Generation wRfx: NONREACTIVE
Immature Grans (Abs): 0 10*3/uL (ref 0.0–0.1)
Immature Granulocytes: 0 %
LYMPHS ABS: 1.8 10*3/uL (ref 0.7–3.1)
Lymphs: 22 %
MCH: 26.8 pg (ref 26.6–33.0)
MCHC: 32.3 g/dL (ref 31.5–35.7)
MCV: 83 fL (ref 79–97)
MONOS ABS: 0.4 10*3/uL (ref 0.1–0.9)
Monocytes: 4 %
NEUTROS ABS: 6.2 10*3/uL (ref 1.4–7.0)
Neutrophils: 73 %
PLATELETS: 277 10*3/uL (ref 150–379)
RBC: 4.07 x10E6/uL (ref 3.77–5.28)
RDW: 14.2 % (ref 12.3–15.4)
RPR Ser Ql: NONREACTIVE
WBC: 8.4 10*3/uL (ref 3.4–10.8)

## 2017-12-26 ENCOUNTER — Telehealth: Payer: Self-pay | Admitting: Certified Nurse Midwife

## 2017-12-26 NOTE — Telephone Encounter (Signed)
Patient called with results of labs. Recommend iron supplement for mild anemia. Follow up as scheduled.

## 2017-12-26 NOTE — Telephone Encounter (Signed)
Patient was called with lab results. Mild anemia. Recommend supplemental iron. Can take stool softener with iron.

## 2017-12-29 ENCOUNTER — Encounter: Payer: Self-pay | Admitting: Emergency Medicine

## 2017-12-29 ENCOUNTER — Emergency Department
Admission: EM | Admit: 2017-12-29 | Discharge: 2017-12-29 | Discharge status: Against Medical Advice | Payer: Self-pay | Attending: Emergency Medicine | Admitting: Emergency Medicine

## 2017-12-29 DIAGNOSIS — R600 Localized edema: Secondary | ICD-10-CM | POA: Insufficient documentation

## 2017-12-29 DIAGNOSIS — O9989 Other specified diseases and conditions complicating pregnancy, childbirth and the puerperium: Secondary | ICD-10-CM | POA: Insufficient documentation

## 2017-12-29 DIAGNOSIS — Z3A29 29 weeks gestation of pregnancy: Secondary | ICD-10-CM | POA: Insufficient documentation

## 2017-12-29 DIAGNOSIS — Z5321 Procedure and treatment not carried out due to patient leaving prior to being seen by health care provider: Secondary | ICD-10-CM | POA: Insufficient documentation

## 2017-12-29 HISTORY — DX: Disorder of thyroid, unspecified: E07.9

## 2017-12-29 NOTE — ED Notes (Signed)
Pt and visitor up to desk asking about wait time; man says pt has an appointment at urgent care in the AM but thought they might be seen here faster; they have decided to leave as pt says she is feeling better; will keep their urgent care appt in the am; encouraged to return for any urgent needs

## 2017-12-29 NOTE — ED Triage Notes (Signed)
Pt ambulatory to triage in NAD, reports swelling to left fourth finger x 2 days, today started draining pus.  Pt reports [redacted]wks pregnant.

## 2018-01-02 ENCOUNTER — Ambulatory Visit (INDEPENDENT_AMBULATORY_CARE_PROVIDER_SITE_OTHER): Payer: Managed Care, Other (non HMO) | Admitting: Obstetrics and Gynecology

## 2018-01-02 VITALS — BP 116/72 | Wt 214.0 lb

## 2018-01-02 DIAGNOSIS — E039 Hypothyroidism, unspecified: Secondary | ICD-10-CM

## 2018-01-02 DIAGNOSIS — O0990 Supervision of high risk pregnancy, unspecified, unspecified trimester: Secondary | ICD-10-CM

## 2018-01-02 DIAGNOSIS — O285 Abnormal chromosomal and genetic finding on antenatal screening of mother: Secondary | ICD-10-CM

## 2018-01-02 DIAGNOSIS — Z3A29 29 weeks gestation of pregnancy: Secondary | ICD-10-CM

## 2018-01-02 DIAGNOSIS — E669 Obesity, unspecified: Secondary | ICD-10-CM

## 2018-01-02 NOTE — Progress Notes (Signed)
ROB

## 2018-01-02 NOTE — Progress Notes (Signed)
MILK BLISTER

## 2018-01-06 ENCOUNTER — Encounter (INDEPENDENT_AMBULATORY_CARE_PROVIDER_SITE_OTHER): Payer: Self-pay

## 2018-01-17 ENCOUNTER — Ambulatory Visit (INDEPENDENT_AMBULATORY_CARE_PROVIDER_SITE_OTHER): Payer: Managed Care, Other (non HMO) | Admitting: Certified Nurse Midwife

## 2018-01-17 VITALS — BP 112/76 | Wt 217.0 lb

## 2018-01-17 DIAGNOSIS — Z3A32 32 weeks gestation of pregnancy: Secondary | ICD-10-CM

## 2018-01-17 DIAGNOSIS — O0990 Supervision of high risk pregnancy, unspecified, unspecified trimester: Secondary | ICD-10-CM

## 2018-01-17 DIAGNOSIS — Z23 Encounter for immunization: Secondary | ICD-10-CM | POA: Diagnosis not present

## 2018-01-17 NOTE — Progress Notes (Signed)
ROB at 32 weeks. Started prenatal classes. Wants to breast feed. Discussed contraception options. +FM. Discussed [facility] instructions. Questions regarding delivery answered Preterm labor precautions discussed. TDAP  Today. Signed BT consent. ROB in 2 weeks.  Farrel Connersolleen Shayne Deerman, CNM

## 2018-01-17 NOTE — Progress Notes (Signed)
Pt reports no problems. tdap and blood consent today.  

## 2018-01-25 ENCOUNTER — Encounter (INDEPENDENT_AMBULATORY_CARE_PROVIDER_SITE_OTHER): Payer: Self-pay

## 2018-01-28 ENCOUNTER — Other Ambulatory Visit: Payer: Self-pay | Admitting: Certified Nurse Midwife

## 2018-01-28 MED ORDER — BREAST PUMP MISC: 0 refills | Status: DC

## 2018-01-30 ENCOUNTER — Encounter: Payer: Self-pay | Admitting: Obstetrics and Gynecology

## 2018-01-30 ENCOUNTER — Ambulatory Visit (INDEPENDENT_AMBULATORY_CARE_PROVIDER_SITE_OTHER): Payer: Managed Care, Other (non HMO) | Admitting: Obstetrics and Gynecology

## 2018-01-30 VITALS — BP 104/64 | Wt 219.0 lb

## 2018-01-30 DIAGNOSIS — O26843 Uterine size-date discrepancy, third trimester: Secondary | ICD-10-CM

## 2018-01-30 DIAGNOSIS — Z789 Other specified health status: Secondary | ICD-10-CM

## 2018-01-30 DIAGNOSIS — O0990 Supervision of high risk pregnancy, unspecified, unspecified trimester: Secondary | ICD-10-CM

## 2018-01-30 DIAGNOSIS — O285 Abnormal chromosomal and genetic finding on antenatal screening of mother: Secondary | ICD-10-CM

## 2018-01-30 DIAGNOSIS — E559 Vitamin D deficiency, unspecified: Secondary | ICD-10-CM

## 2018-01-30 DIAGNOSIS — Z3A32 32 weeks gestation of pregnancy: Secondary | ICD-10-CM

## 2018-01-30 NOTE — Progress Notes (Signed)
Routine Prenatal Care Visit  Subjective  Shelly Coleman is a 32 y.o. G1P0 at [redacted]w[redacted]d being seen today for ongoing prenatal care.  She is currently monitored for the following issues for this high-risk pregnancy and has Acquired hypothyroidism; Migraine with aura and without status migrainosus, not intractable; Obesity (BMI 30-39.9); Supervision of high risk pregnancy, antepartum; Vitamin D deficiency, unspecified; Goiter diffuse; Dyspareunia, female; Chronic anemia; Abnormal screening blood test for Down syndrome in first trimester; and Vegetarian diet on their problem list.  ----------------------------------------------------------------------------------- Patient reports small swelling in her feet. Patient is having midline suprapubic pain and discomfort.  Contractions: Not present. Vag. Bleeding: None.  Movement: Present. Denies leaking of fluid.  ----------------------------------------------------------------------------------- The following portions of the patient's history were reviewed and updated as appropriate: allergies, current medications, past family history, past medical history, past social history, past surgical history and problem list. Problem list updated.   Objective  Blood pressure 104/64, weight 219 lb (99.3 kg), last menstrual period 06/07/2017. Pregravid weight 190 lb (86.2 kg) Total Weight Gain 29 lb (13.2 kg) Body mass index is 37.59 kg/m. Urinalysis: Urine Protein: Negative Urine Glucose: Negative  Fetal Status: Fetal Heart Rate (bpm): 120 Fundal Height: 35 cm Movement: Present     General:  Alert, oriented and cooperative. Patient is in no acute distress.  Skin: Skin is warm and dry. No rash noted.   Cardiovascular: Normal heart rate noted  Respiratory: Normal respiratory effort, no problems with respiration noted  Abdomen: Soft, gravid, appropriate for gestational age. Pain/Pressure: Present     Pelvic:  Cervical exam deferred        Extremities: Normal  range of motion.  Edema: Mild pitting, slight indentation  ental Status: Normal mood and affect. Normal behavior. Normal judgment and thought content.     Assessment   32 y.o. G1P0 at [redacted]w[redacted]d by  03/14/2018, by Last Menstrual Period presenting for routine prenatal visit  Plan   Pregnancy #1 Problems (from 08/10/17 to present)    Problem Noted Resolved   Supervision of high risk pregnancy, antepartum 08/10/2017 by Oswaldo Conroy, CNM No   Overview Addendum 01/30/2018  5:42 PM by Natale Milch, MD      [facility] Prenatal Labs  Dating  LMP=T1US Blood type: O/Positive/-- (09/28 1001)   Genetic Screen NIPS:   Normal XY female Antibody:Negative (09/28 1001)  Anatomic US WNL Rubella: 17.90 (09/28 1001) Varicella: Immune  GTT Early:   Not done     28 wk:  132   RPR: Non Reactive (09/28 1001)   Rhogam  Non indicated HBsAg: Negative (09/28 1001)   TDaP vaccine     01/17/2018                  HIV:   Negative (08/10/17)  Flu Shot   10/09/2017                            GBS:   Contraception LAM Pap: * Needs pap smear*  CBB     CS/VBAC  not applicable   Baby Food Breast   Support Person                 Gestational age appropriate obstetric precautions including but not limited to vaginal bleeding, contractions, leaking of fluid and fetal movement were reviewed in detail with the patient.    Suprapubic pain, discussed belly band for support. Patient declined physical therapy referral at this time  Feet swelling, advised compression stockings and elevating feet. Patient measuring large for gestational age, will obtain growth US. Advised patient adhere to low card healthy diet.  Return in about 1 week (around 02/06/2018) for ROB and US.  Adelene Idlerhristanna Rakhi Romagnoli MD  [facility], [facility] 01/30/2018, 5:45 PM

## 2018-02-05 ENCOUNTER — Encounter: Payer: Self-pay | Admitting: Certified Nurse Midwife

## 2018-02-12 ENCOUNTER — Ambulatory Visit (INDEPENDENT_AMBULATORY_CARE_PROVIDER_SITE_OTHER): Payer: Managed Care, Other (non HMO) | Admitting: Certified Nurse Midwife

## 2018-02-12 ENCOUNTER — Ambulatory Visit (INDEPENDENT_AMBULATORY_CARE_PROVIDER_SITE_OTHER): Payer: Managed Care, Other (non HMO)

## 2018-02-12 VITALS — BP 104/64 | Wt 222.0 lb

## 2018-02-12 DIAGNOSIS — Z3A35 35 weeks gestation of pregnancy: Secondary | ICD-10-CM

## 2018-02-12 DIAGNOSIS — O0990 Supervision of high risk pregnancy, unspecified, unspecified trimester: Secondary | ICD-10-CM

## 2018-02-12 NOTE — Progress Notes (Signed)
Pt reports no problems. States cyst/boil in vaginal area has healed. Growth u/s today.

## 2018-02-12 NOTE — Progress Notes (Signed)
HROB at 35wk5d for ROB and growth scan:  S: Had a sore bump on her bottom-last week, feels much better after moist heat  Baby active. Compression stockings have helped with edema  Wearing maternity support belt-helping pubic bone pain    O: Growth scan: EFW 53% (6#4oz), normal AFI 13.41 cm, cephalic presentation  FH 36 cm/ FHT 134  Exam: small healing folliculitis/boil on left buttock near perineum   A: IUP at 35wk5d weeks with normal growth  Folliculitis-resolving   P: ROB in 1 week. Needs GBS at that time  Breast and bottle feeding. Ordering breast pump  A

## 2018-02-18 ENCOUNTER — Encounter: Payer: Self-pay | Admitting: Certified Nurse Midwife

## 2018-02-21 ENCOUNTER — Ambulatory Visit (INDEPENDENT_AMBULATORY_CARE_PROVIDER_SITE_OTHER): Payer: Managed Care, Other (non HMO) | Admitting: Certified Nurse Midwife

## 2018-02-21 VITALS — BP 112/72 | Wt 225.0 lb

## 2018-02-21 DIAGNOSIS — Z3A37 37 weeks gestation of pregnancy: Secondary | ICD-10-CM

## 2018-02-21 DIAGNOSIS — O0990 Supervision of high risk pregnancy, unspecified, unspecified trimester: Secondary | ICD-10-CM

## 2018-02-21 DIAGNOSIS — Z3685 Encounter for antenatal screening for Streptococcus B: Secondary | ICD-10-CM

## 2018-02-21 MED ORDER — BREAST PUMP MISC: 0 refills | Status: DC

## 2018-02-21 NOTE — Progress Notes (Signed)
HROB at 37 weeks: Pubic bone discomfort when moving or turning over. Baby active. Trace edema lower extremities. Questions answered FH=38cm, FHTs WNL. GBS done. Labor precautions.

## 2018-02-21 NOTE — Progress Notes (Signed)
Pt c/o pressure in pubic bone area. Requests breast pump rx.

## 2018-02-23 LAB — STREP GP B NAA: STREP GROUP B AG: NEGATIVE

## 2018-02-27 ENCOUNTER — Ambulatory Visit (INDEPENDENT_AMBULATORY_CARE_PROVIDER_SITE_OTHER): Payer: Managed Care, Other (non HMO) | Admitting: Certified Nurse Midwife

## 2018-02-27 VITALS — BP 110/70 | Wt 226.0 lb

## 2018-02-27 DIAGNOSIS — E039 Hypothyroidism, unspecified: Secondary | ICD-10-CM

## 2018-02-27 DIAGNOSIS — O0990 Supervision of high risk pregnancy, unspecified, unspecified trimester: Secondary | ICD-10-CM

## 2018-02-27 DIAGNOSIS — Z3A37 37 weeks gestation of pregnancy: Secondary | ICD-10-CM

## 2018-02-27 NOTE — Progress Notes (Signed)
ROB at 37wk 6d: Denies contractions, vaginal bleeding, leakage of fluid. +FM Reviewed FKCs and labor precautions. Enc to preregister Discussed use of fiber laxatives to help with constipation ROB in 1 week  Farrel Connersolleen Paisley Grajeda, CNM

## 2018-02-27 NOTE — Progress Notes (Signed)
Pt c/o pain in pubic bone and difficulty walking. Desires cervical check.

## 2018-03-05 ENCOUNTER — Ambulatory Visit (INDEPENDENT_AMBULATORY_CARE_PROVIDER_SITE_OTHER): Payer: Managed Care, Other (non HMO) | Admitting: Certified Nurse Midwife

## 2018-03-05 VITALS — BP 118/76 | Temp 97.8°F | Wt 230.0 lb

## 2018-03-05 DIAGNOSIS — Z3A38 38 weeks gestation of pregnancy: Secondary | ICD-10-CM

## 2018-03-05 DIAGNOSIS — O0990 Supervision of high risk pregnancy, unspecified, unspecified trimester: Secondary | ICD-10-CM

## 2018-03-05 NOTE — Progress Notes (Signed)
Pt c/o feeling feverish and scratchy throat last night and this morning relieved by tylenol. No fever.

## 2018-03-07 ENCOUNTER — Encounter: Payer: Self-pay | Admitting: Certified Nurse Midwife

## 2018-03-07 NOTE — Progress Notes (Signed)
ROB at 38wk5d: Has some mild cold symptoms, discussed OTC and comfort measures. Baby active. Pelvic pressure but no regular contractions or vaginal bleeding. ROB in 1 week. Will do cervical exam at that time and schedule IOL Labor precautions  Farrel Connersolleen Latrisha Coiro, CNM

## 2018-03-12 ENCOUNTER — Encounter: Payer: Self-pay | Admitting: Certified Nurse Midwife

## 2018-03-13 ENCOUNTER — Ambulatory Visit (INDEPENDENT_AMBULATORY_CARE_PROVIDER_SITE_OTHER): Payer: Managed Care, Other (non HMO) | Admitting: Certified Nurse Midwife

## 2018-03-13 VITALS — BP 130/78 | Wt 231.0 lb

## 2018-03-13 DIAGNOSIS — O0990 Supervision of high risk pregnancy, unspecified, unspecified trimester: Secondary | ICD-10-CM

## 2018-03-13 DIAGNOSIS — L0232 Furuncle of buttock: Secondary | ICD-10-CM

## 2018-03-13 DIAGNOSIS — Z3A39 39 weeks gestation of pregnancy: Secondary | ICD-10-CM

## 2018-03-13 MED ORDER — CEPHALEXIN 500 MG PO CAPS: 500.0000 mg | ORAL_CAPSULE | Freq: Three times a day (TID) | ORAL | 2 refills | Status: DC

## 2018-03-13 MED ORDER — FLUCONAZOLE 150 MG PO TABS
ORAL_TABLET | ORAL | 3 refills | Status: DC
Start: 2018-03-13 — End: 2018-03-23

## 2018-03-13 NOTE — Progress Notes (Signed)
ROB at 39wk 6d: having some BH contractions and pelvic pressure and a blood tinged mucous discharge. +FM FH 40 cm and FHTs WNL Cervix: ?dilation, extremely posterior/ patulous ext os, and can't reach internal os/ -1/ ?50% Has a boil on her right buttock near perineum. RX for Keflex tid x 7 days Diflucan 150 mgm every 3 days x 2 doses, warm compresses Scheduled IOL for 8 May at 0500. Discussed induction methods, process. Labor precautions RTO on 6 May for BP check as blood pressure a little higher than usual and for FU on boil Farrel Conners, CNM

## 2018-03-13 NOTE — Progress Notes (Signed)
Pt reports no problems. Feels like she lost her mucous plug. Desires cervical check.

## 2018-03-18 ENCOUNTER — Ambulatory Visit (INDEPENDENT_AMBULATORY_CARE_PROVIDER_SITE_OTHER): Payer: Managed Care, Other (non HMO) | Admitting: Certified Nurse Midwife

## 2018-03-18 VITALS — BP 122/78 | Wt 230.0 lb

## 2018-03-18 DIAGNOSIS — Z3A40 40 weeks gestation of pregnancy: Secondary | ICD-10-CM

## 2018-03-18 DIAGNOSIS — O1213 Gestational proteinuria, third trimester: Secondary | ICD-10-CM

## 2018-03-18 DIAGNOSIS — O0990 Supervision of high risk pregnancy, unspecified, unspecified trimester: Secondary | ICD-10-CM

## 2018-03-18 NOTE — Progress Notes (Signed)
Pt reports braxton hicks ctx. Desires cervical check.

## 2018-03-19 ENCOUNTER — Encounter: Payer: Self-pay | Admitting: Certified Nurse Midwife

## 2018-03-19 LAB — PROTEIN / CREATININE RATIO, URINE
Creatinine, Urine: 160.5 mg/dL
PROTEIN/CREAT RATIO: 417 mg/g{creat} — AB (ref 0–200)
Protein, Ur: 67 mg/dL

## 2018-03-19 NOTE — H&P (Addendum)
OB History & Physical   History of Present Illness:  Chief Complaint:  Patient presents for scheduling IOL and blood pressure check. HPI:  Shelly Coleman is a 32 y.o. G1P0 female with EDC=03/14/2018 at [redacted]w[redacted]d dated by LMP=10wk ultrasound.  Her pregnancy has been complicated by hypothyroidism and currently takes 50 mcg of Synthroid daily. Her pregnancy was also remarkable for a positive screen for Down syndrome on her first trimester screen of 1:170. NIPT test showed diploid XY results.  She has gained 40# with her pregnancy and her BMI is currently 39.5 kg.m2. She had +1 proteinuria on dipstick today and a PC ratio was sent. Her blood pressure was 122/78 and she was asymptomatic of preeclampsia except for edema. She developed a boil on her right buttock and has been taking Keflex and applying warm compresses and it has been improving.    Has also had some spotting after cervical exams. Denies LOF. Prenatal care site: [facility]atal care at [facility] has also been remarkable for  [facility] Prenatal Labs  Dating  LMP=10 wk [place] Blood type: O/Positive/-- (09/28 1001)   Genetic Screen NIPS:   Normal XY female Antibody:Negative (09/28 1001)  Anatomic US WNL Rubella: 17.90 (09/28 1001) Varicella: Immune  GTT Early:   Not done     28 wk:  132   RPR: Non Reactive (09/28 1001)   Rhogam  Non indicated HBsAg: Negative (09/28 1001)   TDaP vaccine     01/17/2018                  HIV:   Negative (08/10/17)  Flu Shot   10/09/2017                            GBS: negative 4/11  Contraception LAM Pap: * Needs pap smear*  CBB     CS/VBAC  not applicable   Baby Food Breast   Support Person             Maternal Medical History:   Past Medical History:  Diagnosis Date  . Obesity (BMI 35.0-39.9 without comorbidity)   . Thyroid disease   . Vegetarian diet     Past Surgical History:  Procedure Laterality Date  . NO PAST SURGERIES      No Known Allergies  Prior to Admission medications   Medication Sig  Start Date End Date Taking? Authorizing Provider  cephALEXin (KEFLEX) 500 MG capsule Take 1 capsule (500 mg total) by mouth 3 (three) times daily. 03/13/18  Yes Farrel Conners, CNM  Cholecalciferol (VITAMIN D3) 5000 units CAPS Take 1 capsule by mouth daily.   Yes [provider]  ferrous sulfate 325 (65 FE) MG tablet Take by mouth.   Yes [provider]  fluconazole (DIFLUCAN) 150 MG tablet Take one tablet every three days x 2 doses 03/13/18  Yes Farrel Conners, CNM  levothyroxine (SYNTHROID, LEVOTHROID) 50 MCG tablet Take 50 mcg by mouth daily. Take two tablets (100 mcg) on Sundays. 03/30/17  Yes [provider]                Misc. Devices (BREAST PUMP) MISC Dispense one breast pump for patient 02/21/18  Yes Farrel Conners, CNM  Prenatal Vit-Fe Fumarate-FA (MULTIVITAMIN-PRENATAL) 27-0.8 MG TABS tablet Take 1 tablet by mouth daily at 12 noon.   Yes [provider]          Social History: She  reports that she has never smoked. She  has never used smokeless tobacco. She reports that she does not drink alcohol or use drugs.  Family History: family history includes Leukemia in her maternal grandfather.   Review of Systems: Negative x 10 systems reviewed except as noted in the HPI.      Physical Exam:  Vital Signs: BP 122/78   Wt 104.3 kg (230 lb)   LMP 06/07/2017 (Exact Date)   BMI 39.48 kg/m  General: gravid female no acute distress.  HEENT: normocephalic, atraumatic Heart: regular rate & rhythm.  No murmurs/rubs/gallops Lungs: clear to auscultation bilaterally Abdomen: soft, gravid, non-tender;  EFW: 8 1/2#, FHR 140 Pelvic:   External: Normal external female genitalia  Vagina: no masses  Cervix:1.5/50%/-1 Extremities: non-tender, symmetric, trace non pitting edema pretibially bilaterally.  DTRs: +2  Neurologic: Alert & oriented x 3.      Assessment:  Shelly Coleman is a 32 y.o. G1P0 female at [redacted]w[redacted]d in labor  Proteinuria with  normal blood pressure   Plan:  1. IOL scheduled for 5/8 2. CBC, T&S, CMP, PC ratio, Clrs, IVF 3. GBS negative.   4.  Discussed induction process and risks of hyperstimulation, fetal intolerance to labor, failure to progress, and Cesarean section. Probably will start IOL with Cytotec.  Farrel Conners, CNM

## 2018-03-19 NOTE — Progress Notes (Signed)
Shelly Coleman at 40wk4d: Had irregular contractions over the weekend. No headache, visual changes, RUQ pain. Baby active. Spotting x 2 days after last exam. Less pedal and ankle edema. Continues on Keflex for the boil on right buttock BP 122/78, but +1 protein. No dysuria. Urine appears concentrated IOL scheduled in 2 days at 0500 Cervix: 1.5/50%/-1/soft/posterior Probably will use Cytotec for induction +/- foley bulb Labor precautions PC ratio sent Farrel Conners, CNM

## 2018-03-20 ENCOUNTER — Inpatient Hospital Stay
Admission: EM | Admit: 2018-03-20 | Discharge: 2018-03-23 | DRG: 787 | Disposition: A | Discharge status: Home / Self Care | Payer: Managed Care, Other (non HMO) | Attending: Obstetrics and Gynecology | Admitting: Obstetrics and Gynecology

## 2018-03-20 ENCOUNTER — Inpatient Hospital Stay: Payer: Managed Care, Other (non HMO) | Admitting: Anesthesiology

## 2018-03-20 ENCOUNTER — Encounter
Admission: EM | Disposition: A | Discharge status: Home / Self Care | Payer: Self-pay | Source: Home / Self Care | Attending: Obstetrics and Gynecology

## 2018-03-20 ENCOUNTER — Other Ambulatory Visit: Payer: Self-pay

## 2018-03-20 DIAGNOSIS — O99214 Obesity complicating childbirth: Secondary | ICD-10-CM | POA: Diagnosis present

## 2018-03-20 DIAGNOSIS — Z3A40 40 weeks gestation of pregnancy: Secondary | ICD-10-CM

## 2018-03-20 DIAGNOSIS — E039 Hypothyroidism, unspecified: Secondary | ICD-10-CM | POA: Diagnosis present

## 2018-03-20 DIAGNOSIS — O9081 Anemia of the puerperium: Secondary | ICD-10-CM | POA: Diagnosis not present

## 2018-03-20 DIAGNOSIS — O99284 Endocrine, nutritional and metabolic diseases complicating childbirth: Secondary | ICD-10-CM | POA: Diagnosis present

## 2018-03-20 DIAGNOSIS — Z3490 Encounter for supervision of normal pregnancy, unspecified, unspecified trimester: Secondary | ICD-10-CM | POA: Diagnosis present

## 2018-03-20 DIAGNOSIS — D62 Acute posthemorrhagic anemia: Secondary | ICD-10-CM | POA: Diagnosis not present

## 2018-03-20 DIAGNOSIS — E669 Obesity, unspecified: Secondary | ICD-10-CM | POA: Diagnosis present

## 2018-03-20 DIAGNOSIS — Z98891 History of uterine scar from previous surgery: Secondary | ICD-10-CM

## 2018-03-20 LAB — COMPREHENSIVE METABOLIC PANEL
ALBUMIN: 2.7 g/dL — AB (ref 3.5–5.0)
ALT: 17 U/L (ref 14–54)
AST: 20 U/L (ref 15–41)
Alkaline Phosphatase: 104 U/L (ref 38–126)
Anion gap: 10 (ref 5–15)
BUN: 13 mg/dL (ref 6–20)
CHLORIDE: 107 mmol/L (ref 101–111)
CO2: 17 mmol/L — ABNORMAL LOW (ref 22–32)
Calcium: 9.3 mg/dL (ref 8.9–10.3)
Creatinine, Ser: 0.44 mg/dL (ref 0.44–1.00)
GFR calc Af Amer: >60 mL/min (ref >60)
GLUCOSE: 109 mg/dL — AB (ref 65–99)
POTASSIUM: 4.2 mmol/L (ref 3.5–5.1)
Sodium: 134 mmol/L — ABNORMAL LOW (ref 135–145)
Total Bilirubin: 0.2 mg/dL — ABNORMAL LOW (ref 0.3–1.2)
Total Protein: 6.8 g/dL (ref 6.5–8.1)

## 2018-03-20 LAB — CBC
HEMATOCRIT: 35.4 % (ref 35.0–47.0)
Hemoglobin: 12 g/dL (ref 12.0–16.0)
MCH: 27.7 pg (ref 26.0–34.0)
MCHC: 33.9 g/dL (ref 32.0–36.0)
MCV: 81.5 fL (ref 80.0–100.0)
PLATELETS: 255 10*3/uL (ref 150–440)
RBC: 4.34 MIL/uL (ref 3.80–5.20)
RDW: 15 % — ABNORMAL HIGH (ref 11.5–14.5)
WBC: 9.5 10*3/uL (ref 3.6–11.0)

## 2018-03-20 LAB — PROTEIN / CREATININE RATIO, URINE
CREATININE, URINE: 76 mg/dL
Protein Creatinine Ratio: 0.32 mg/mg{Cre} — ABNORMAL HIGH (ref 0.00–0.15)
Total Protein, Urine: 24 mg/dL

## 2018-03-20 LAB — TYPE AND SCREEN
ABO/RH(D): O POS
ANTIBODY SCREEN: NEGATIVE

## 2018-03-20 SURGERY — Surgical Case
Anesthesia: General | Wound class: Clean Contaminated

## 2018-03-20 MED ORDER — PHENYLEPHRINE 40 MCG/ML (10ML) SYRINGE FOR IV PUSH (FOR BLOOD PRESSURE SUPPORT): 80.0000 ug | PREFILLED_SYRINGE | INTRAVENOUS | Status: DC | PRN

## 2018-03-20 MED ORDER — SODIUM CHLORIDE 0.9 % IV SOLN
INTRAVENOUS | Status: DC | PRN
  Administered 2018-03-20: 07:00:00 via INTRAVENOUS

## 2018-03-20 MED ORDER — NALBUPHINE HCL 10 MG/ML IJ SOLN
5.0000 mg | INTRAMUSCULAR | Status: DC | PRN
  Filled 2018-03-20: qty 0.5

## 2018-03-20 MED ORDER — CEFAZOLIN SODIUM-DEXTROSE 2-3 GM-%(50ML) IV SOLR
INTRAVENOUS | Status: DC | PRN
  Administered 2018-03-20: 2 g via INTRAVENOUS

## 2018-03-20 MED ORDER — KETOROLAC TROMETHAMINE 30 MG/ML IJ SOLN
INTRAMUSCULAR | Status: AC
  Administered 2018-03-20: 30 mg
  Filled 2018-03-20: qty 1

## 2018-03-20 MED ORDER — ONDANSETRON HCL 4 MG/2ML IJ SOLN
INTRAMUSCULAR | Status: DC | PRN
  Administered 2018-03-20: 4 mg via INTRAVENOUS

## 2018-03-20 MED ORDER — MISOPROSTOL 25 MCG QUARTER TABLET: 25.0000 ug | ORAL_TABLET | ORAL | Status: DC | PRN

## 2018-03-20 MED ORDER — MISOPROSTOL 200 MCG PO TABS: 800.0000 ug | ORAL_TABLET | Freq: Once | ORAL | Status: DC | PRN

## 2018-03-20 MED ORDER — EPHEDRINE 5 MG/ML INJ: 10.0000 mg | INTRAVENOUS | Status: DC | PRN

## 2018-03-20 MED ORDER — SOD CITRATE-CITRIC ACID 500-334 MG/5ML PO SOLN
ORAL | Status: AC
  Administered 2018-03-20: 30 mL
  Filled 2018-03-20: qty 15

## 2018-03-20 MED ORDER — DIPHENHYDRAMINE HCL 50 MG/ML IJ SOLN: 12.5000 mg | INTRAMUSCULAR | Status: DC | PRN

## 2018-03-20 MED ORDER — OXYCODONE-ACETAMINOPHEN 5-325 MG PO TABS
2.0000 | ORAL_TABLET | ORAL | Status: DC | PRN
  Administered 2018-03-21 – 2018-03-23 (×8): 2 via ORAL
  Filled 2018-03-20 (×8): qty 2

## 2018-03-20 MED ORDER — BUPIVACAINE 0.25 % ON-Q PUMP DUAL CATH 400 ML
400.0000 mL | INJECTION | Status: DC
  Filled 2018-03-20: qty 400

## 2018-03-20 MED ORDER — OXYCODONE HCL 5 MG PO TABS
5.0000 mg | ORAL_TABLET | ORAL | Status: DC | PRN
  Filled 2018-03-20: qty 1

## 2018-03-20 MED ORDER — NALBUPHINE HCL 10 MG/ML IJ SOLN
5.0000 mg | Freq: Once | INTRAMUSCULAR | Status: DC | PRN
  Filled 2018-03-20: qty 0.5

## 2018-03-20 MED ORDER — MIDAZOLAM HCL 2 MG/2ML IJ SOLN
INTRAMUSCULAR | Status: DC | PRN
  Administered 2018-03-20: 2 mg via INTRAVENOUS

## 2018-03-20 MED ORDER — OXYCODONE HCL 5 MG/5ML PO SOLN: 5.0000 mg | Freq: Once | ORAL | Status: DC | PRN

## 2018-03-20 MED ORDER — FERROUS SULFATE 325 (65 FE) MG PO TABS
325.0000 mg | ORAL_TABLET | Freq: Two times a day (BID) | ORAL | Status: DC
  Administered 2018-03-20 – 2018-03-23 (×6): 325 mg via ORAL
  Filled 2018-03-20 (×6): qty 1

## 2018-03-20 MED ORDER — VITAMIN D3 125 MCG (5000 UT) PO CAPS: 1.0000 | ORAL_CAPSULE | Freq: Every day | ORAL | Status: DC

## 2018-03-20 MED ORDER — OXYTOCIN 10 UNIT/ML IJ SOLN
INTRAMUSCULAR | Status: AC
  Filled 2018-03-20: qty 2

## 2018-03-20 MED ORDER — AMMONIA AROMATIC IN INHA: 0.3000 mL | Freq: Once | RESPIRATORY_TRACT | Status: DC | PRN

## 2018-03-20 MED ORDER — SODIUM CHLORIDE 0.9 % IV SOLN
INTRAVENOUS | Status: DC | PRN
  Administered 2018-03-20 (×2): 5 mL via EPIDURAL

## 2018-03-20 MED ORDER — LACTATED RINGERS IV SOLN: 500.0000 mL | INTRAVENOUS | Status: DC | PRN

## 2018-03-20 MED ORDER — PHENYLEPHRINE HCL 10 MG/ML IJ SOLN
INTRAMUSCULAR | Status: AC
  Filled 2018-03-20: qty 1

## 2018-03-20 MED ORDER — MIDAZOLAM HCL 2 MG/2ML IJ SOLN
INTRAMUSCULAR | Status: AC
  Filled 2018-03-20: qty 2

## 2018-03-20 MED ORDER — EPHEDRINE SULFATE 50 MG/ML IJ SOLN
INTRAMUSCULAR | Status: AC
  Filled 2018-03-20: qty 1

## 2018-03-20 MED ORDER — KETOROLAC TROMETHAMINE 30 MG/ML IJ SOLN
30.0000 mg | Freq: Four times a day (QID) | INTRAMUSCULAR | Status: AC
  Filled 2018-03-20: qty 1

## 2018-03-20 MED ORDER — FENTANYL 2.5 MCG/ML W/ROPIVACAINE 0.15% IN NS 100 ML EPIDURAL (ARMC)
12.0000 mL/h | EPIDURAL | Status: DC
  Administered 2018-03-20: 12 mL/h via EPIDURAL

## 2018-03-20 MED ORDER — KETOROLAC TROMETHAMINE 30 MG/ML IJ SOLN
30.0000 mg | Freq: Four times a day (QID) | INTRAMUSCULAR | Status: AC
  Administered 2018-03-20 – 2018-03-21 (×3): 30 mg via INTRAVENOUS
  Filled 2018-03-20 (×4): qty 1

## 2018-03-20 MED ORDER — CEFOTETAN DISODIUM 2 G IJ SOLR
2.0000 g | INTRAMUSCULAR | Status: DC
  Filled 2018-03-20: qty 2

## 2018-03-20 MED ORDER — AMMONIA AROMATIC IN INHA
RESPIRATORY_TRACT | Status: AC
  Filled 2018-03-20: qty 10

## 2018-03-20 MED ORDER — SUCCINYLCHOLINE CHLORIDE 20 MG/ML IJ SOLN
INTRAMUSCULAR | Status: AC
  Filled 2018-03-20: qty 1

## 2018-03-20 MED ORDER — DIPHENHYDRAMINE HCL 25 MG PO CAPS: 25.0000 mg | ORAL_CAPSULE | Freq: Four times a day (QID) | ORAL | Status: DC | PRN

## 2018-03-20 MED ORDER — ACETAMINOPHEN 325 MG PO TABS
ORAL_TABLET | ORAL | Status: AC
  Filled 2018-03-20: qty 2

## 2018-03-20 MED ORDER — LACTATED RINGERS IV SOLN
INTRAVENOUS | Status: DC
  Administered 2018-03-20: 01:00:00 via INTRAVENOUS

## 2018-03-20 MED ORDER — PROMETHAZINE HCL 25 MG/ML IJ SOLN: 6.2500 mg | INTRAMUSCULAR | Status: DC | PRN

## 2018-03-20 MED ORDER — ACETAMINOPHEN 325 MG PO TABS: 650.0000 mg | ORAL_TABLET | ORAL | Status: DC | PRN

## 2018-03-20 MED ORDER — COCONUT OIL OIL: 1.0000 "application " | TOPICAL_OIL | Status: DC | PRN

## 2018-03-20 MED ORDER — FENTANYL CITRATE (PF) 100 MCG/2ML IJ SOLN
INTRAMUSCULAR | Status: AC
  Filled 2018-03-20: qty 2

## 2018-03-20 MED ORDER — BISACODYL 10 MG RE SUPP: 10.0000 mg | Freq: Every day | RECTAL | Status: DC | PRN

## 2018-03-20 MED ORDER — FENTANYL CITRATE (PF) 100 MCG/2ML IJ SOLN
INTRAMUSCULAR | Status: DC | PRN
  Administered 2018-03-20: 100 ug via INTRAVENOUS

## 2018-03-20 MED ORDER — MISOPROSTOL 200 MCG PO TABS
ORAL_TABLET | ORAL | Status: AC
  Filled 2018-03-20: qty 4

## 2018-03-20 MED ORDER — MORPHINE SULFATE (PF) 0.5 MG/ML IJ SOLN
INTRAMUSCULAR | Status: DC | PRN
  Administered 2018-03-20: 3 mg via EPIDURAL

## 2018-03-20 MED ORDER — PHENYLEPHRINE HCL 10 MG/ML IJ SOLN
INTRAMUSCULAR | Status: DC | PRN
  Administered 2018-03-20: 100 ug via INTRAVENOUS
  Administered 2018-03-20: 200 ug via INTRAVENOUS
  Administered 2018-03-20: 100 ug via INTRAVENOUS
  Administered 2018-03-20: 200 ug via INTRAVENOUS
  Administered 2018-03-20 (×3): 100 ug via INTRAVENOUS
  Administered 2018-03-20: 200 ug via INTRAVENOUS
  Administered 2018-03-20 (×2): 100 ug via INTRAVENOUS

## 2018-03-20 MED ORDER — LACTATED RINGERS IV SOLN: 500.0000 mL | Freq: Once | INTRAVENOUS | Status: DC

## 2018-03-20 MED ORDER — DEXAMETHASONE SODIUM PHOSPHATE 4 MG/ML IJ SOLN
INTRAMUSCULAR | Status: DC | PRN
  Administered 2018-03-20: 10 mg via INTRAVENOUS

## 2018-03-20 MED ORDER — ACETAMINOPHEN 325 MG PO TABS
650.0000 mg | ORAL_TABLET | Freq: Four times a day (QID) | ORAL | Status: AC
  Administered 2018-03-20 – 2018-03-21 (×4): 650 mg via ORAL
  Filled 2018-03-20 (×3): qty 2

## 2018-03-20 MED ORDER — FENTANYL CITRATE (PF) 100 MCG/2ML IJ SOLN: 25.0000 ug | INTRAMUSCULAR | Status: DC | PRN

## 2018-03-20 MED ORDER — TERBUTALINE SULFATE 1 MG/ML IJ SOLN
0.2500 mg | Freq: Once | INTRAMUSCULAR | Status: AC | PRN
  Administered 2018-03-20: 0.25 mg via SUBCUTANEOUS
  Filled 2018-03-20: qty 1

## 2018-03-20 MED ORDER — ONDANSETRON HCL 4 MG/2ML IJ SOLN: 4.0000 mg | Freq: Three times a day (TID) | INTRAMUSCULAR | Status: DC | PRN

## 2018-03-20 MED ORDER — PRENATAL MULTIVITAMIN CH
1.0000 | ORAL_TABLET | Freq: Every day | ORAL | Status: DC
  Administered 2018-03-20 – 2018-03-22 (×3): 1 via ORAL
  Filled 2018-03-20 (×3): qty 1

## 2018-03-20 MED ORDER — LIDOCAINE HCL (PF) 1 % IJ SOLN
INTRAMUSCULAR | Status: AC
  Filled 2018-03-20: qty 30

## 2018-03-20 MED ORDER — WITCH HAZEL-GLYCERIN EX PADS: 1.0000 "application " | MEDICATED_PAD | CUTANEOUS | Status: DC | PRN

## 2018-03-20 MED ORDER — OXYTOCIN 40 UNITS IN LACTATED RINGERS INFUSION - SIMPLE MED
2.5000 [IU]/h | INTRAVENOUS | Status: AC
  Filled 2018-03-20: qty 1000

## 2018-03-20 MED ORDER — LIDOCAINE-EPINEPHRINE (PF) 1.5 %-1:200000 IJ SOLN
INTRAMUSCULAR | Status: DC | PRN
  Administered 2018-03-20: 3 mL via EPIDURAL

## 2018-03-20 MED ORDER — SENNOSIDES-DOCUSATE SODIUM 8.6-50 MG PO TABS
2.0000 | ORAL_TABLET | ORAL | Status: DC
  Administered 2018-03-21 – 2018-03-22 (×3): 2 via ORAL
  Filled 2018-03-20 (×3): qty 2

## 2018-03-20 MED ORDER — BUPIVACAINE 0.5 % ON-Q PUMP DUAL CATH 300 ML
300.0000 mL | INJECTION | Status: DC
  Filled 2018-03-20: qty 300

## 2018-03-20 MED ORDER — MORPHINE SULFATE (PF) 0.5 MG/ML IJ SOLN
INTRAMUSCULAR | Status: AC
  Filled 2018-03-20: qty 10

## 2018-03-20 MED ORDER — SODIUM CHLORIDE 0.9 % IV SOLN: 2.0000 g | INTRAVENOUS | Status: DC

## 2018-03-20 MED ORDER — BUPIVACAINE HCL (PF) 0.5 % IJ SOLN
INTRAMUSCULAR | Status: AC
  Filled 2018-03-20: qty 30

## 2018-03-20 MED ORDER — SODIUM CHLORIDE 0.9% FLUSH: 3.0000 mL | INTRAVENOUS | Status: DC | PRN

## 2018-03-20 MED ORDER — VITAMIN D 1000 UNITS PO TABS
5000.0000 [IU] | ORAL_TABLET | Freq: Every day | ORAL | Status: DC
  Administered 2018-03-20 – 2018-03-22 (×3): 5000 [IU] via ORAL
  Filled 2018-03-20 (×3): qty 5

## 2018-03-20 MED ORDER — LACTATED RINGERS IV SOLN
INTRAVENOUS | Status: DC
  Administered 2018-03-21 (×2): via INTRAVENOUS

## 2018-03-20 MED ORDER — NALOXONE HCL 0.4 MG/ML IJ SOLN
0.4000 mg | INTRAMUSCULAR | Status: DC | PRN
  Filled 2018-03-20: qty 1

## 2018-03-20 MED ORDER — ONDANSETRON HCL 4 MG/2ML IJ SOLN
INTRAMUSCULAR | Status: AC
  Filled 2018-03-20: qty 2

## 2018-03-20 MED ORDER — OXYCODONE HCL 5 MG PO TABS: 5.0000 mg | ORAL_TABLET | Freq: Once | ORAL | Status: DC | PRN

## 2018-03-20 MED ORDER — MEPERIDINE HCL 50 MG/ML IJ SOLN: 6.2500 mg | INTRAMUSCULAR | Status: DC | PRN

## 2018-03-20 MED ORDER — SIMETHICONE 80 MG PO CHEW
80.0000 mg | CHEWABLE_TABLET | Freq: Three times a day (TID) | ORAL | Status: DC
  Administered 2018-03-20 – 2018-03-23 (×8): 80 mg via ORAL
  Filled 2018-03-20 (×8): qty 1

## 2018-03-20 MED ORDER — ZOLPIDEM TARTRATE 5 MG PO TABS: 5.0000 mg | ORAL_TABLET | Freq: Every evening | ORAL | Status: DC | PRN

## 2018-03-20 MED ORDER — MENTHOL 3 MG MT LOZG
1.0000 | LOZENGE | OROMUCOSAL | Status: DC | PRN
  Filled 2018-03-20: qty 9

## 2018-03-20 MED ORDER — FLEET ENEMA 7-19 GM/118ML RE ENEM: 1.0000 | ENEMA | Freq: Every day | RECTAL | Status: DC | PRN

## 2018-03-20 MED ORDER — SOD CITRATE-CITRIC ACID 500-334 MG/5ML PO SOLN: 30.0000 mL | ORAL | Status: DC

## 2018-03-20 MED ORDER — OXYTOCIN 40 UNITS IN LACTATED RINGERS INFUSION - SIMPLE MED
INTRAVENOUS | Status: DC | PRN
  Administered 2018-03-20: 1000 mL via INTRAVENOUS
  Administered 2018-03-20: 600 mL via INTRAVENOUS

## 2018-03-20 MED ORDER — LEVOTHYROXINE SODIUM 50 MCG PO TABS
50.0000 ug | ORAL_TABLET | Freq: Every day | ORAL | Status: DC
  Administered 2018-03-22 – 2018-03-23 (×2): 50 ug via ORAL
  Filled 2018-03-20 (×4): qty 1

## 2018-03-20 MED ORDER — LIDOCAINE HCL (PF) 1 % IJ SOLN
INTRAMUSCULAR | Status: DC | PRN
  Administered 2018-03-20 (×2): 1 mL via INTRADERMAL

## 2018-03-20 MED ORDER — LIDOCAINE HCL (PF) 1 % IJ SOLN: 30.0000 mL | INTRAMUSCULAR | Status: DC | PRN

## 2018-03-20 MED ORDER — ONDANSETRON HCL 4 MG/2ML IJ SOLN: 4.0000 mg | Freq: Four times a day (QID) | INTRAMUSCULAR | Status: DC | PRN

## 2018-03-20 MED ORDER — PROPOFOL 10 MG/ML IV BOLUS
INTRAVENOUS | Status: DC | PRN
  Administered 2018-03-20: 200 mg via INTRAVENOUS

## 2018-03-20 MED ORDER — EPHEDRINE SULFATE 50 MG/ML IJ SOLN
INTRAMUSCULAR | Status: DC | PRN
  Administered 2018-03-20 (×4): 10 mg via INTRAVENOUS

## 2018-03-20 MED ORDER — DEXAMETHASONE SODIUM PHOSPHATE 10 MG/ML IJ SOLN
INTRAMUSCULAR | Status: AC
  Filled 2018-03-20: qty 1

## 2018-03-20 MED ORDER — FENTANYL 2.5 MCG/ML W/ROPIVACAINE 0.15% IN NS 100 ML EPIDURAL (ARMC)
EPIDURAL | Status: AC
  Filled 2018-03-20: qty 100

## 2018-03-20 MED ORDER — SIMETHICONE 80 MG PO CHEW
80.0000 mg | CHEWABLE_TABLET | ORAL | Status: DC
  Administered 2018-03-21 – 2018-03-22 (×3): 80 mg via ORAL
  Filled 2018-03-20 (×3): qty 1

## 2018-03-20 MED ORDER — LIDOCAINE-EPINEPHRINE (PF) 2 %-1:200000 IJ SOLN
INTRAMUSCULAR | Status: DC | PRN
  Administered 2018-03-20 (×3): 5 mL via INTRADERMAL

## 2018-03-20 MED ORDER — OXYTOCIN BOLUS FROM INFUSION: 500.0000 mL | Freq: Once | INTRAVENOUS | Status: DC

## 2018-03-20 MED ORDER — PROPOFOL 10 MG/ML IV BOLUS
INTRAVENOUS | Status: AC
  Filled 2018-03-20: qty 20

## 2018-03-20 MED ORDER — OXYTOCIN 40 UNITS IN LACTATED RINGERS INFUSION - SIMPLE MED
2.5000 [IU]/h | INTRAVENOUS | Status: DC
  Filled 2018-03-20: qty 1000

## 2018-03-20 MED ORDER — OXYCODONE-ACETAMINOPHEN 5-325 MG PO TABS
1.0000 | ORAL_TABLET | ORAL | Status: DC | PRN
  Administered 2018-03-22 (×2): 1 via ORAL
  Filled 2018-03-20 (×2): qty 1

## 2018-03-20 MED ORDER — DIBUCAINE 1 % RE OINT: 1.0000 "application " | TOPICAL_OINTMENT | RECTAL | Status: DC | PRN

## 2018-03-20 MED ORDER — DIPHENHYDRAMINE HCL 25 MG PO CAPS: 25.0000 mg | ORAL_CAPSULE | ORAL | Status: DC | PRN

## 2018-03-20 MED ORDER — SUCCINYLCHOLINE CHLORIDE 20 MG/ML IJ SOLN
INTRAMUSCULAR | Status: DC | PRN
  Administered 2018-03-20: 100 mg via INTRAVENOUS

## 2018-03-20 MED ORDER — BUTORPHANOL TARTRATE 2 MG/ML IJ SOLN
1.0000 mg | INTRAMUSCULAR | Status: DC | PRN
  Administered 2018-03-20: 1 mg via INTRAVENOUS
  Filled 2018-03-20: qty 1

## 2018-03-20 MED ORDER — OXYCODONE HCL 5 MG PO TABS
10.0000 mg | ORAL_TABLET | ORAL | Status: DC | PRN
  Filled 2018-03-20: qty 2

## 2018-03-20 MED ORDER — SIMETHICONE 80 MG PO CHEW: 80.0000 mg | CHEWABLE_TABLET | ORAL | Status: DC | PRN

## 2018-03-20 SURGICAL SUPPLY — 23 items
CANISTER SUCT 3000ML PPV (MISCELLANEOUS) ×2 IMPLANT
CHLORAPREP W/TINT 26ML (MISCELLANEOUS) ×4 IMPLANT
DERMABOND ADVANCED (GAUZE/BANDAGES/DRESSINGS) ×1
DERMABOND ADVANCED .7 DNX12 (GAUZE/BANDAGES/DRESSINGS) ×1 IMPLANT
DRSG OPSITE POSTOP 4X10 (GAUZE/BANDAGES/DRESSINGS) ×2 IMPLANT
ELECT CAUTERY BLADE 6.4 (BLADE) ×2 IMPLANT
ELECT REM PT RETURN 9FT ADLT (ELECTROSURGICAL) ×2
ELECTRODE REM PT RTRN 9FT ADLT (ELECTROSURGICAL) ×1 IMPLANT
GLOVE BIOGEL PI IND STRL 6.5 (GLOVE) ×1 IMPLANT
GLOVE BIOGEL PI INDICATOR 6.5 (GLOVE) ×1
GOWN STRL REUS W/ TWL LRG LVL3 (GOWN DISPOSABLE) ×1 IMPLANT
GOWN STRL REUS W/ TWL XL LVL3 (GOWN DISPOSABLE) ×2 IMPLANT
GOWN STRL REUS W/TWL LRG LVL3 (GOWN DISPOSABLE) ×1
GOWN STRL REUS W/TWL XL LVL3 (GOWN DISPOSABLE) ×2
NS IRRIG 1000ML POUR BTL (IV SOLUTION) ×2 IMPLANT
PACK C SECTION AR (MISCELLANEOUS) ×2 IMPLANT
PAD OB MATERNITY 4.3X12.25 (PERSONAL CARE ITEMS) ×2 IMPLANT
PAD PREP 24X41 OB/GYN DISP (PERSONAL CARE ITEMS) ×2 IMPLANT
SUT MNCRL AB 4-0 PS2 18 (SUTURE) ×2 IMPLANT
SUT PLAIN 3-0 (SUTURE) ×2 IMPLANT
SUT VIC AB 0 CT1 36 (SUTURE) ×6 IMPLANT
SUT VIC AB 2-0 CT1 36 (SUTURE) ×2 IMPLANT
SYR 30ML LL (SYRINGE) ×4 IMPLANT

## 2018-03-20 NOTE — Discharge Summary (Signed)
OB Discharge Summary     Patient Name: Shelly Coleman DOB: 07-01-1986 MRN: [id number]  Date of admission: 03/20/2018 Delivering MD: Natale Milch, MD  Date of Delivery: 03/20/2018  Date of discharge: 03/23/2018  Admitting diagnosis: contractions Intrauterine pregnancy: [redacted]w[redacted]d     Secondary diagnosis: None     Discharge diagnosis: Term Pregnancy Delivered, Reasons for cesarean section  Non-reassuring [facility]                         Hospital course:  Onset of Labor With Unplanned C/S  32 y.o. yo G1P1001 at [redacted]w[redacted]d was admitted in Active Labor on 03/20/2018. Patient had a labor course significant for prolonged deceleration after she pushed once.. Membrane Rupture Time/Date: 3:30 AM ,03/20/2018   The patient went for cesarean section due to Non-Reassuring FHR, and delivered a Viable infant, 03/20/2018. Details of the operation can be found in separate operative note. Patient had an uncomplicated postpartum course.  She is ambulating,tolerating a regular diet, passing flatus, and urinating well.  Patient is discharged home in stable condition 03/23/18.                                                                 Post partum procedures: none  Complications: None  Physical exam on 03/23/2018: Vitals:   03/22/18 1541 03/22/18 2045 03/23/18 0050 03/23/18 0822  BP: 117/81 139/71 127/78 131/78  Pulse: 85 86 85 88  Resp: Temp: 98.2 F (36.8 C) 98.3 F (36.8 C) 99.1 F (37.3 C) 97.7 F (36.5 C)  TempSrc: Oral Oral Oral Oral  SpO2: 98% 100% 100% 100%  Weight:      Height:       General: alert, cooperative and no distress Lochia: appropriate Uterine Fundus: firm Incision: Dressing is clean, dry, and intact DVT Evaluation: No evidence of DVT seen on physical exam.  Labs: Lab Results  Component Value Date   WBC 12.3 (H) 03/21/2018   HGB 8.0 (L) 03/21/2018   HCT 24.3 (L) 03/21/2018   MCV 83.0 03/21/2018   PLT 182 03/21/2018   CMP Latest Ref Rng & Units 03/20/2018  Glucose  65 - 99 mg/dL 829(F)  BUN 6 - 20 mg/dL 13  Creatinine 6.21 - 3.08 mg/dL 6.57  Sodium 846 - 962 mmol/L 134(L)  Potassium 3.5 - 5.1 mmol/L 4.2  Chloride 101 - 111 mmol/L 107  CO2 22 - 32 mmol/L 17(L)  Calcium 8.9 - 10.3 mg/dL 9.3  Total Protein 6.5 - 8.1 g/dL 6.8  Total Bilirubin 0.3 - 1.2 mg/dL 9.5(M)  Alkaline Phos 38 - 126 U/L 104  AST 15 - 41 U/L 20  ALT 14 - 54 U/L 17    Discharge instruction: per After Visit Summary.  Medications:  Allergies as of 03/23/2018   No Known Allergies     Medication List    STOP taking these medications   cephALEXin 500 MG capsule Commonly known as:  KEFLEX   fluconazole 150 MG tablet Commonly known as:  DIFLUCAN     TAKE these medications   Breast Pump Misc Dispense one breast pump for patient   ferrous sulfate 325 (65 FE) MG tablet Take by mouth.   levothyroxine 50 MCG tablet Commonly known  as:  SYNTHROID, LEVOTHROID Take 50 mcg by mouth daily. Take two tablets (100 mcg) on Sundays.   multivitamin-prenatal 27-0.8 MG Tabs tablet Take 1 tablet by mouth daily at 12 noon.   oxyCODONE-acetaminophen 5-325 MG tablet Commonly known as:  PERCOCET/ROXICET Take 1-2 tablets by mouth every 6 (six) hours as needed.   Vitamin D3 5000 units Caps Take 1 capsule by mouth daily.            Discharge Care Instructions  (From admission, onward)        Start     Ordered   03/23/18 0000  Discharge wound care:    Comments:  You may apply a light dressing for minor discharge from the incision or to keep waistbands of clothing from rubbing.  You may also have been discharge with a clear dressing in which case this will be removed at your postoperative clinic visit.  You may shower, use soap on your incision.  Avoid baths or soaking the incision in the first 6 weeks following your surgery.Marland Kitchen   03/23/18 1035      Diet: routine diet  Activity: Advance as tolerated. Pelvic rest for 6 weeks.   Outpatient follow up:   Follow-up Information     Schuman, Jaquelyn Bitter, MD. Schedule an appointment as soon as possible for a visit in 1 week(s).   Specialty:  Obstetrics and Gynecology Why:  Post-operative appointment Contact information: [address] [place] [address] [phone]           Postpartum contraception: Undecided Rhogam Given postpartum: no Rubella vaccine given postpartum: no Varicella vaccine given postpartum: no TDaP given antepartum or postpartum: Yes  Newborn Data: Live born female  Birth Weight: 7 lb 8.6 oz (3420 g) APGAR: 9, 9  Newborn Delivery   Birth date/time:  03/20/2018 07:40:00 Delivery type:  C-Section, Low Transverse C-section categorization:  Primary      Baby Feeding: Breast and formula  Disposition: home with mother  SIGNED:  Oswaldo Conroy, CNM 03/23/2018 11:34 AM

## 2018-03-20 NOTE — Anesthesia Procedure Notes (Signed)
Epidural Patient location during procedure: OB Start time: 03/20/2018 3:14 AM End time: 03/20/2018 3:24 AM  Staffing Anesthesiologist: Cleona Doubleday, Cleda Mccreedy, MD Performed: anesthesiologist   Preanesthetic Checklist Completed: patient identified, site marked, surgical consent, pre-op evaluation, timeout performed, IV checked, risks and benefits discussed and monitors and equipment checked  Epidural Patient position: sitting Prep: ChloraPrep Patient monitoring: heart rate, continuous pulse ox and blood pressure Approach: midline Location: L3-L4 Injection technique: LOR saline  Needle:  Needle type: Tuohy  Needle gauge: 17 G Needle length: 9 cm and 9 Needle insertion depth: 7 cm Catheter type: closed end flexible Catheter size: 19 Gauge Catheter at skin depth: 12 cm Test dose: negative and 1.5% lidocaine with Epi 1:200 K  Assessment Sensory level: T10 Events: blood not aspirated, injection not painful, no injection resistance, negative IV test and no paresthesia  Additional Notes 2 attempts Pt. Evaluated and documentation done after procedure finished. Patient identified. Risks/Benefits/Options discussed with patient including but not limited to bleeding, infection, nerve damage, paralysis, failed block, incomplete pain control, headache, blood pressure changes, nausea, vomiting, reactions to medication both or allergic, itching and postpartum back pain. Confirmed with bedside nurse the patient's most recent platelet count. Confirmed with patient that they are not currently taking any anticoagulation, have any bleeding history or any family history of bleeding disorders. Patient expressed understanding and wished to proceed. All questions were answered. Sterile technique was used throughout the entire procedure. Please see nursing notes for vital signs. Test dose was given through epidural catheter and negative prior to continuing to dose epidural or start infusion. Warning signs of high  block given to the patient including shortness of breath, tingling/numbness in hands, complete motor block, or any concerning symptoms with instructions to call for help. Patient was given instructions on fall risk and not to get out of bed. All questions and concerns addressed with instructions to call with any issues or inadequate analgesia.   Patient tolerated the insertion well without immediate complications.Reason for block:procedure for pain

## 2018-03-20 NOTE — Anesthesia Procedure Notes (Addendum)
Procedure Name: Intubation Date/Time: 03/20/2018 7:37 AM Performed by: Sherol Dade, CRNA Pre-anesthesia Checklist: Patient identified, Emergency Drugs available, Suction available, Patient being monitored and Timeout performed Patient Re-evaluated:Patient Re-evaluated prior to induction Oxygen Delivery Method: Circle system utilized Preoxygenation: Pre-oxygenation with 100% oxygen Induction Type: IV induction Laryngoscope Size: Glidescope and 3 Grade View: Grade I Tube type: Oral Tube size: 7.0 mm Number of attempts: 1 Placement Confirmation: ETT inserted through vocal cords under direct vision,  positive ETCO2 and breath sounds checked- equal and bilateral Secured at: 22 cm Tube secured with: Tape Dental Injury: Teeth and Oropharynx as per pre-operative assessment

## 2018-03-20 NOTE — Anesthesia Post-op Follow-up Note (Signed)
Anesthesia QCDR form completed.        

## 2018-03-20 NOTE — Progress Notes (Signed)
Patient ID: Shelly Coleman, female   DOB: 02-26-1986, 31 y.o.   MRN: [id number]  Called for fetal heart rate decelerations. Patient received epidural and afterwards began experiencing declererations of fetal heart rate from baseline 120s to the 90s. This occurred about 4 times at  4:00, 4:22, 4:34, and  4:49. Each deceleration lasted less than 1 minute with a good recovery to the baseline. Patient was repositioned, given a fluid bolus, and placed on O2. Exam by nursing showed clear fluid, bloody show and that she was 6.5/80/-1. Patient is concerned about fetal status, but reassurance given. Category II tracing, moderate variability, baseline 120 bpm. Continue with expectant management of labor.   Adelene Idler MD [facility], [facility] 03/20/18 5:15 AM

## 2018-03-20 NOTE — Transfer of Care (Signed)
Immediate Anesthesia Transfer of Care Note  Patient: Shelly Coleman  Procedure(s) Performed: CESAREAN SECTION- STAT (N/A )  Patient Location: PACU  Anesthesia Type:General  Level of Consciousness: drowsy and patient cooperative  Airway & Oxygen Therapy: Patient Spontanous Breathing and Patient connected to face mask oxygen  Post-op Assessment: Report given to RN, Post -op Vital signs reviewed and stable and Patient moving all extremities  Post vital signs: Reviewed and stable  Last Vitals:  Vitals Value Taken Time  BP    Temp 36.3 C 03/20/2018  8:52 AM  Pulse    Resp    SpO2      Last Pain:  Vitals:   03/20/18 0342  TempSrc: Oral  PainSc:          Complications: No apparent anesthesia complications

## 2018-03-20 NOTE — Lactation Note (Signed)
This note was copied from a baby's chart. Lactation Consultation Note  Patient Name: Boy Adiba Fargnoli ZOXWR'U Date: 03/20/2018   Baby finally had a good breastfeeding this last session with help of nipple shield. We tried without, but he kept sliding off nipple and creasing tip. His tongue does tend to stay behind gumline. On finger exam, he clamps down with his gums instead of extending his tongue. I'm not sure at this point if it is because he won't vs he cannot due to tight frenulum. It does look tight. With the NS, he was able to get a good enough latch that breast pulsed with every suck. His suck became much more rhythmic. We saw some colostrum in the shield chamber. He nursed  over 30 minutes on the left breast, then another 7 minutes on the right after a meconium diaper change.   Mom still needs to learn how to apply nipple shield. She is quite tired and plans to rest now. Dad has been very supportive and washes shield and helps mom and baby. LC to F/U tomorrow and check tongue function then.      Maternal Data    Feeding Feeding Type: Breast Fed Length of feed: 30 min  LATCH Score Latch: Grasps breast easily, tongue down, lips flanged, rhythmical sucking.(with nipple shield and breast compression)  Audible Swallowing: A few with stimulation  Type of Nipple: Everted at rest and after stimulation  Comfort (Breast/Nipple): Soft / non-tender  Hold (Positioning): Assistance needed to correctly position infant at breast and maintain latch.  LATCH Score: 8  Interventions Interventions: Breast feeding basics reviewed;Assisted with latch;Skin to skin;Breast compression;Adjust position;Support pillows;Position options  Lactation Tools Discussed/Used Tools: Nipple Hasnain Manheim Nipple shield size: 24   Consult Status Consult Status: Follow-up Date: 03/21/18    Sunday Corn 03/20/2018, 5:20 PM

## 2018-03-20 NOTE — H&P (Signed)
Date of Initial H&P: 5/6  History reviewed, patient examined, and changes noted below  Patient scheduled for IOL but presented this AM in labor. Was 5/70%/-2 on admission and contracting every 1-5 minutes. Her initial blood pressure was a little elevated at 140/87. PIH labs were normal except for a mildly elevated PC ratio of 320 mgm. She received an epidural and then had SROM shortly after her epidural.  FHR was reassuring until after the epidural when she had la few late decelerations. FHR responded to position changes, IV fluid and and O2. She continued to progress and is now C/C/+1 to +2 and LOA. FHR now 120s with acceleration to 150 with scalp stimulation from cervical exam. Contractions every 2-3 minutes apart. BP 135/77.  Is not feeling any pelvic pressure. Will let her labor down.  Farrel Conners, CNM

## 2018-03-20 NOTE — Anesthesia Preprocedure Evaluation (Signed)
Anesthesia Evaluation  Patient identified by MRN, date of birth, ID band Patient awake    Reviewed: Allergy & Precautions, H&P , NPO status , Patient's Chart, lab work & pertinent test results  History of Anesthesia Complications Negative for: history of anesthetic complications  Airway Mallampati: III  TM Distance: >3 FB Neck ROM: full    Dental  (+) Chipped   Pulmonary neg pulmonary ROS,           Cardiovascular Exercise Tolerance: Good (-) hypertensionnegative cardio ROS       Neuro/Psych  Headaches,    GI/Hepatic negative GI ROS,   Endo/Other  Hypothyroidism   Renal/GU   negative genitourinary   Musculoskeletal   Abdominal   Peds  Hematology negative hematology ROS (+)   Anesthesia Other Findings Past Medical History: No date: Obesity (BMI 35.0-39.9 without comorbidity) No date: Thyroid disease No date: Vegetarian diet  Past Surgical History: No date: NO PAST SURGERIES  BMI    Body Mass Index:  39.48 kg/m      Reproductive/Obstetrics (+) Pregnancy                             Anesthesia Physical Anesthesia Plan  ASA: III  Anesthesia Plan: Epidural   Post-op Pain Management:    Induction:   PONV Risk Score and Plan:   Airway Management Planned:   Additional Equipment:   Intra-op Plan:   Post-operative Plan:   Informed Consent: I have reviewed the patients History and Physical, chart, labs and discussed the procedure including the risks, benefits and alternatives for the proposed anesthesia with the patient or authorized representative who has indicated his/her understanding and acceptance.     Plan Discussed with: Anesthesiologist  Anesthesia Plan Comments:         Anesthesia Quick Evaluation

## 2018-03-20 NOTE — Op Note (Signed)
Cesarean Section Procedure Note Indications: Prolonged fetal heart rate deceleration  Pre-operative Diagnosis: Intrauterine pregnancy [redacted]w[redacted]d ;  Non reassurin fetal heart tones and term intrauterine pregnancy Post-operative Diagnosis: same, delivered. Procedure: Low Transverse Cesarean Section Surgeon: Adelene Idler MD Assistant(s): Berenice Bouton CNM Anesthesia: General endotracheal anesthesia Estimated Blood Loss:500 Complications: None; patient tolerated the procedure well. Disposition: PACU - hemodynamically stable. Condition: stable  Findings: A female infant in the cephalic presentation. Amniotic fluid - None  Birth weight 7lbs 9 oz.  Apgars of 9 and 9.  Intact placenta with a three-vessel cord. Grossly normal uterus, tubes and ovaries bilaterally. No intraabdominal adhesions were noted.  Procedure Details   The patient was taken to Operating Room, identified as the correct patient and the procedure verified as C-Section Delivery. A Time Out was held and the above information confirmed. The patient was draped and prepped in the usual sterile manner. Epidural anesthesia was not sufficient. General anesthesia was induced.  A Pfannenstiel incision was made and carried down through the subcutaneous tissue to the fascia. Fascial incision was made and extended transversely with the scalpel. The fascia was separated from the underlying rectus tissue superiorly and inferiorly using the mayo scissors. The peritoneum was identified and entered bluntly. Peritoneal incision was extended longitudinally by stretching. A low transverse hysterotomy was made with the scalpel. The fetus was delivered atraumatically. The umbilical cord was clamped x2 and cut and the infant was handed to the awaiting pediatricians. The placenta was removed intact and appeared normal with a 3-vessel cord. The uterus was exteriorized and cleared of all clot and debris. The hysterotomy was closed with running sutures of  0 Vicryl suture. A second imbricating layer was placed with the same suture. Excellent hemostasis was observed. The uterus was returned to the abdomen. The pelvis was cleared of clots and again, excellent hemostasis was noted. The peritoneum was closed with 2-0 Vicryl with a running stitch.  The On Q Pain pump System was then placed.  Trocars were placed through the abdominal wall into the subfascial space and these were used to thread the silver soaker cathaters into place.The rectus fascia was then reapproximated with running sutures of 0-Vicryl, with careful placement not to incorporate the cathaters. Subcutaneous tissues are then irrigated with saline and hemostasis assured.  Subcuticular fat was close with 3-0 plain suture with a running stitch.  Skin was then closed with 4-0 vicryl suture in a subcuticular fashion followed by skin adhesive. The cathaters are flushed each with 5 mL of Bupivicaine and stabilized into place with dressing. Instrument, sponge, and needle counts were correct prior to the abdominal closure and at the conclusion of the case.  The patient tolerated the procedure well and was transferred to the recovery room in stable condition.   Natale Milch MD [facility], [facility] 03/20/2018  8:34 AM

## 2018-03-20 NOTE — Anesthesia Procedure Notes (Signed)
Anesthesia Procedure Note Approx 0845: Labor epidural removed after c-section complete. Tip intact. Pt tolerated well, vitals stable.

## 2018-03-20 NOTE — Progress Notes (Signed)
L&D Progress Note Late note  Started pushing with patient and after the first push there was a prolonged  FHR deceleration to a nadir of 50.  Patient was turned from side to side then placed in trendelenberg, O2 applied, and IV bolus given. Dr Jerene Pitch and Dr Tiburcio Pea present in room. Terbutaline 0.25 mgm given Subcut. FHR recovered to 120s after 10 minutes. Decision made for a Cesarean section by Dr Jerene Pitch. Explained plan and reason for CS to patient and her husband and they agree with POM. Farrel Conners, CNM

## 2018-03-20 NOTE — Lactation Note (Signed)
This note was copied from a baby's chart. Lactation Consultation Note  Patient Name: Shelly Coleman ZOXWR'U Date: 03/20/2018 Reason for consult: Initial assessment;1st time breastfeeding;Primapara;Maternal endocrine disorder Type of Endocrine Disorder?: Thyroid   Per family request and approval of PACU, I brought baby with dad to PACU for a breastfeeding session. Baby was vigorously rooting. I placed him in reclined cradle, skin to skin with Mom. Mom still unable to move from chest down so I held him in place the whole time. He would root and lose latch over and over again. Tongue seems to to stay back behind gumline, but I could not fully assess tongue function at this time and circumstance. I finally applied a 24 mm nipple shield which helped him stay latched on. He mostly held it in his mouth with only brief attempts to suck. I tried both breasts and tried some suck training. He seemed content after this 40 minute session, but I doubt he transferred much/any colostrum. Mom is getting prepared to come upstairs to her MBU room, so I wrapped baby and brought him back upstairs to room 351 with Dad and notified scn RN Tiffany of updates.    Maternal Data Does the patient have breastfeeding experience prior to this delivery?: No  Feeding Length of feed: 1 min(attempt 40 minutes)  LATCH Score Latch: Repeated attempts needed to sustain latch, nipple held in mouth throughout feeding, stimulation needed to elicit sucking reflex.(poor suck relfex; held nipple in mouth mostly)  Audible Swallowing: None  Type of Nipple: Everted at rest and after stimulation  Comfort (Breast/Nipple): Soft / non-tender  Hold (Positioning): Full assist, staff holds infant at breast(general and epidural for C/S)  LATCH Score: 5  Interventions Interventions: Assisted with latch;Skin to skin;Adjust position;Support pillows;Position options  Lactation Tools Discussed/Used Tools: Nipple Angi Goodell(due to constant lost  latches)   Consult Status Consult Status: Follow-up Date: 03/21/18    Sunday Corn 03/20/2018, 10:34 AM

## 2018-03-21 ENCOUNTER — Encounter: Payer: Self-pay | Admitting: Obstetrics and Gynecology

## 2018-03-21 LAB — CBC
HEMATOCRIT: 24.3 % — AB (ref 35.0–47.0)
HEMOGLOBIN: 8 g/dL — AB (ref 12.0–16.0)
MCH: 27.4 pg (ref 26.0–34.0)
MCHC: 33 g/dL (ref 32.0–36.0)
MCV: 83 fL (ref 80.0–100.0)
Platelets: 182 10*3/uL (ref 150–440)
RBC: 2.93 MIL/uL — AB (ref 3.80–5.20)
RDW: 15.3 % — ABNORMAL HIGH (ref 11.5–14.5)
WBC: 12.3 10*3/uL — ABNORMAL HIGH (ref 3.6–11.0)

## 2018-03-21 LAB — RPR: RPR Ser Ql: NONREACTIVE

## 2018-03-21 NOTE — Lactation Note (Signed)
This note was copied from a baby's chart. Lactation Consultation Note  Patient Name: Boy Jannett Schmall [id number] Date: 03/21/2018 Reason for consult: Follow-up assessment Type of Endocrine Disorder?: Thyroid   Maternal Data Does the patient have breastfeeding experience prior to this delivery?: No  Feeding   supplement with 18 ml formula LATCH Score Latch: Repeated attempts needed to sustain latch, nipple held in mouth throughout feeding, stimulation needed to elicit sucking reflex.  Audible Swallowing: A few with stimulation  Type of Nipple: Everted at rest and after stimulation  Comfort (Breast/Nipple): Soft / non-tender  Hold (Positioning): Assistance needed to correctly position infant at breast and maintain latch.  LATCH Score: 7  Interventions Interventions: Breast feeding basics reviewed;Assisted with latch  Lactation Tools Discussed/Used     Consult Status Consult Status: Follow-up Follow-up type: In-patient    Trudee Grip 03/21/2018, 7:03 PM

## 2018-03-21 NOTE — Addendum Note (Signed)
Addendum  created 03/21/18 0854 by Karoline Caldwell, CRNA   Sign clinical note

## 2018-03-21 NOTE — Progress Notes (Signed)
POD#1 pLTCS Subjective:  States that she feels well. Has been out of bed and ambulated in room without difficulty. Passing gas, tolerating a regular diet, voiding. Pain control is adequate with PRN medication.  Objective:  Blood pressure 127/70, pulse 90, temperature 98 F (36.7 C), temperature source Oral, resp. rate 18, height _0  (1.626 m), weight 230 lb (104.3 kg), last menstrual period 06/07/2017, SpO2 100 %, currently breastfeeding.  General: NAD Pulmonary: no increased work of breathing Abdomen: non-distended, non-tender, fundus firm at level of umbilicus, lochia appropriate Incision: Dressing C/D/I, On-Q infusing Extremities: no edema, no erythema, no tenderness  Results for orders placed or performed during the hospital encounter of 03/20/18 (from the past 72 hour(s))  CBC     Status: Abnormal   Collection Time: 03/20/18 12:25 AM  Result Value Ref Range   WBC 9.5 3.6 - 11.0 K/uL   RBC 4.34 3.80 - 5.20 MIL/uL   Hemoglobin 12.0 12.0 - 16.0 g/dL   HCT 35.4 35.0 - 47.0 %   MCV 81.5 80.0 - 100.0 fL   MCH 27.7 26.0 - 34.0 pg   MCHC 33.9 32.0 - 36.0 g/dL   RDW 15.0 (H) 11.5 - 14.5 %   Platelets 255 150 - 440 K/uL    Comment: Performed at [facility], [address] [place], [place] [address]  Comprehensive metabolic panel     Status: Abnormal   Collection Time: 03/20/18 12:25 AM  Result Value Ref Range   Sodium 134 (L) 135 - 145 mmol/L   Potassium 4.2 3.5 - 5.1 mmol/L   Chloride 107 101 - 111 mmol/L   CO2 17 (L) 22 - 32 mmol/L   Glucose, Bld 109 (H) 65 - 99 mg/dL   BUN 13 6 - 20 mg/dL   Creatinine, Ser 0.44 0.44 - 1.00 mg/dL   Calcium 9.3 8.9 - 10.3 mg/dL   Total Protein 6.8 6.5 - 8.1 g/dL   Albumin 2.7 (L) 3.5 - 5.0 g/dL   AST 20 15 - 41 U/L   ALT 17 14 - 54 U/L   Alkaline Phosphatase 104 38 - 126 U/L   Total Bilirubin 0.2 (L) 0.3 - 1.2 mg/dL   GFR calc non Af Amer >60 >60 mL/min   GFR calc Af Amer >60 >60 mL/min    Comment: (NOTE) The eGFR has been  calculated using the CKD EPI equation. This calculation has not been validated in all clinical situations. eGFR's persistently <60 mL/min signify possible Chronic Kidney Disease.    Anion gap 10 5 - 15    Comment: Performed at [facility], [address] [place], [place] [address]  RPR     Status: None   Collection Time: 03/20/18 12:25 AM  Result Value Ref Range   RPR Ser Ql Non Reactive Non Reactive    Comment: (NOTE) Performed At: [facility] [place], [place] [id number] Rush Farmer MD [id number] Performed at [facility], [address] [place], [place] [address]   Protein / creatinine ratio, urine     Status: Abnormal   Collection Time: 03/20/18 12:28 AM  Result Value Ref Range   Creatinine, Urine 76 mg/dL   Total Protein, Urine 24 mg/dL    Comment: NO NORMAL RANGE ESTABLISHED FOR THIS TEST   Protein Creatinine Ratio 0.32 (H) 0.00 - 0.15 mg/mg[Cre]    Comment: Performed at [facility], [address], [place], [place] [address]  Type and screen     Status:  None   Collection Time: 03/20/18  2:31 AM  Result Value Ref Range   ABO/RH(D) O POS    Antibody Screen NEG    Sample Expiration      03/23/2018 Performed at [facility], [address] [place], [place] [address]   CBC     Status: Abnormal   Collection Time: 03/21/18  5:34 AM  Result Value Ref Range   WBC 12.3 (H) 3.6 - 11.0 K/uL   RBC 2.93 (L) 3.80 - 5.20 MIL/uL   Hemoglobin 8.0 (L) 12.0 - 16.0 g/dL    Comment: RESULT REPEATED AND VERIFIED   HCT 24.3 (L) 35.0 - 47.0 %   MCV 83.0 80.0 - 100.0 fL   MCH 27.4 26.0 - 34.0 pg   MCHC 33.0 32.0 - 36.0 g/dL   RDW 15.3 (H) 11.5 - 14.5 %   Platelets 182 150 - 440 K/uL    Comment: Performed at [facility], [address], [place], [place] [address]     Assessment:   32 y.o. G1P1001 post-operative day #1 in good condition.   Plan:  1) Acute blood loss anemia - hemodynamically stable  and asymptomatic - PO ferrous sulfate  2) Blood Type --/--/O POS (05/08 0231) / Rubella 17.90 (09/28 1001) / Varicella Immune  3) TDAP status   4) Breast and formula  5) Disposition: continue postpartum care, Anticipate discharge on POD#3.  Avel Sensor, CNM 03/21/2018  2:17 PM

## 2018-03-21 NOTE — Anesthesia Postprocedure Evaluation (Addendum)
Anesthesia Post Note  Patient: [name]  Procedure(s) Performed: CESAREAN SECTION- STAT (N/A )  Patient location during evaluation: Mother Baby Anesthesia Type: General Level of consciousness: awake Pain management: pain level controlled Vital Signs Assessment: post-procedure vital signs reviewed and stable Respiratory status: spontaneous breathing Cardiovascular status: blood pressure returned to baseline Postop Assessment: no headache, no backache, no apparent nausea or vomiting and adequate PO intake Anesthetic complications: no     Last Vitals:  Vitals:   03/20/18 1947 03/20/18 2355  BP: 122/69 107/60  Pulse: 90 79  Resp: 20 18  Temp: 36.5 C 36.7 C  SpO2: 97% 98%    Last Pain:  Vitals:   03/21/18 0616  TempSrc:   PainSc: 0-No pain                 [name]

## 2018-03-21 NOTE — Anesthesia Post-op Follow-up Note (Signed)
  Anesthesia Pain Follow-up Note  Patient: Shelly Coleman  Day #: 1  Date of Follow-up: 03/21/2018 Time: 8:51 AM  Last Vitals:  Vitals:   03/20/18 1947 03/20/18 2355  BP: 122/69 107/60  Pulse: 90 79  Resp: 20 18  Temp: 36.5 C 36.7 C  SpO2: 97% 98%    Level of Consciousness: alert  Pain: none   Side Effects:None  Catheter Site Exam:clean, dry, no drainage     Plan: D/C from anesthesia care at surgeon's request  Karoline Caldwell

## 2018-03-21 NOTE — Lactation Note (Signed)
This note was copied from a baby's chart. Lactation Consultation Note  Patient Name: Shelly Coleman [id number] Date: 03/21/2018 Reason for consult: Follow-up assessment Type of Endocrine Disorder?: Thyroid   Maternal Data Does the patient have breastfeeding experience prior to this delivery?: No  Feeding    LATCH Score Latch: Repeated attempts needed to sustain latch, nipple held in mouth throughout feeding, stimulation needed to elicit sucking reflex.  Audible Swallowing: A few with stimulation  Type of Nipple: Everted at rest and after stimulation  Comfort (Breast/Nipple): Soft / non-tender  Hold (Positioning): Assistance needed to correctly position infant at breast and maintain latch.  LATCH Score: 7  Interventions Interventions: Breast feeding basics reviewed;Assisted with latch  Lactation Tools Discussed/Used     Consult Status Consult Status: Follow-up Follow-up type: In-patient    Trudee Grip 03/21/2018, 7:04 PM

## 2018-03-21 NOTE — Lactation Note (Signed)
This note was copied from a baby's chart. Lactation Consultation Note  Patient Name: Shelly Coleman [id number] Date: 03/21/2018 Reason for consult: Initial assessment Type of Endocrine Disorder?: Thyroid   Maternal Data Does the patient have breastfeeding experience prior to this delivery?: No  Feeding Feeding Type: Breast Fed Length of feed: 30 min  LATCH Score Latch: Repeated attempts needed to sustain latch, nipple held in mouth throughout feeding, stimulation needed to elicit sucking reflex.  Audible Swallowing: A few with stimulation  Type of Nipple: Everted at rest and after stimulation  Comfort (Breast/Nipple): Soft / non-tender  Hold (Positioning): Assistance needed to correctly position infant at breast and maintain latch.  LATCH Score: 7  Interventions Interventions: Breast feeding basics reviewed;Assisted with latch;Support pillows;Position options;Breast massage;Hand express  Lactation Tools Discussed/Used  Concern for supplementation may be needed. Breasts are wide spaced. I got the mother pumping.   Consult Status Consult Status: Follow-up Follow-up type: In-patient    Trudee Grip 03/21/2018, 6:40 PM

## 2018-03-22 NOTE — Lactation Note (Signed)
This note was copied from a baby's chart. Lactation Consultation Note  Patient Name: Shelly Coleman [id number] Date: 03/22/2018     Maternal Data    Feeding Feeding Type: Bottle Fed - Formula  LATCH Score                   Interventions    Lactation Tools Discussed/Used     Consult Status   Mom is to continue pumping q2/3hrs until supply comes in. Getting thyroid checked to see if meds need to be increased. Parents will call LC Dept next week and come to support group.    Burnadette Peter 03/22/2018, 5:16 PM

## 2018-03-22 NOTE — Progress Notes (Signed)
  Subjective:   Post Op Day 2 Patient is tolerating PO intake. Her pain is managed with PO medications and On Q pump. She relies on regular dosing of PO pain meds to control her pain. She is ambulating to the bathroom. She is voiding without difficulty and admits bowel movement yesterday. She feels some swelling in her legs. Patient informed of nature of diuresing postpartum. She was breastfeeding but has switched to formula due to latch difficulty.   Objective:  Blood pressure 107/89, pulse 85, temperature 98 F (36.7 C), temperature source Oral, resp. rate 20, height  (1.626 m), weight 230 lb (104.3 kg), last menstrual period 06/07/2017, SpO2 99 %  General: NAD Pulmonary: no increased work of breathing Abdomen: non-distended, non-tender, fundus firm at level of umbilicus Incision: honeycomb dressing is C/D/I, On Q pump is intact Extremities: no edema, no erythema, no tenderness   Intake/Output Summary (Last 24 hours) at 03/22/2018 1029 Last data filed at 03/21/2018 1715 Gross per 24 hour  Intake 3390.09 ml  Output 700 ml  Net 2690.09 ml     Assessment:   31 y.o. G1P1001 postoperativeday # 2   Plan:  1) Acute blood loss anemia - hemodynamically stable and asymptomatic - po ferrous sulfate  2) O positive, Rubella Immune, Varicella Immune  3) TDAP status: given antepartum  4) Bottle/Contraception: undecided  5) Encouraged to increase ambulation today  6) Disposition: discharge to home tomorrow   Tresea Mall, CNM

## 2018-03-22 NOTE — Lactation Note (Signed)
This note was copied from a baby's chart. Lactation Consultation Note  Patient Name: Boy Jalilah Wiltsie [id number] Date: 03/22/2018 Reason for consult: Follow-up assessment   Maternal Data  Mom has very little colostrum PP at 52hrs. Yesterday LC implemented hand expression and pumping with DEBP to start the cycle of increasing supply. Mom says she had no change in breast size during pregnancy, but areolas did get darker. No soreness, sensitivity during pregnancy either. Baby hasn't been able to latch onto mom's breast due to possible tongue tie.   Feeding Feeding Type: Bottle Fed - Formula Nipple Type: Slow - flow Length of feed: 10 min  LATCH Score                   Interventions    Lactation Tools Discussed/Used Tools: Bottle Pump Review: Milk Storage;Setup, frequency, and cleaning Initiated by:: Leroy Sea) Date initiated:: 03/21/18   Consult Status  Baby is currently formula feeding via bottle until mom's supply can increase. Parents were given information for pediatric dentist to evaluate TT. Mom has been told to pump q2 for the next 6 hrs to see if anything different occurs. Mom can then space out to normal q3/4hrs.     Burnadette Peter 03/22/2018, 12:27 PM

## 2018-03-23 MED ORDER — OXYCODONE-ACETAMINOPHEN 5-325 MG PO TABS: 1.0000 | ORAL_TABLET | Freq: Four times a day (QID) | ORAL | 0 refills | Status: DC | PRN

## 2018-03-23 NOTE — Progress Notes (Signed)
Reviewed D/C instructions with pt and family. Pt verbalized understanding of teaching. Discharged to home via W/C. Pt to schedule f/u appt.  

## 2018-03-25 ENCOUNTER — Encounter: Payer: Self-pay | Admitting: Obstetrics & Gynecology

## 2018-03-25 ENCOUNTER — Ambulatory Visit (INDEPENDENT_AMBULATORY_CARE_PROVIDER_SITE_OTHER): Payer: Managed Care, Other (non HMO) | Admitting: Obstetrics & Gynecology

## 2018-03-25 VITALS — BP 130/80 | Ht 64.0 in | Wt 221.0 lb

## 2018-03-25 DIAGNOSIS — Z98891 History of uterine scar from previous surgery: Secondary | ICD-10-CM

## 2018-03-25 NOTE — Progress Notes (Signed)
  Postoperative Follow-up Patient presents post op from CS for FITL while pushing, 1 week ago.  Subjective: Patient reports some improvement in her preop symptoms, still has some pain as well as back pain. Eating a regular diet without difficulty. Pain is controlled with current analgesics. Medications being used: ibuprofen (OTC).  Percocet causes constipation so has stopped. Activity: sedentary. Patient reports vaginal sx's of Irregular bleeding still.  Objective: BP 130/80   Ht  (1.626 m)   Wt 221 lb (100.2 kg)   BMI 37.93 kg/m  Physical Exam  Constitutional: She is oriented to person, place, and time. She appears well-developed and well-nourished. No distress.  Cardiovascular: Normal rate.  Pulmonary/Chest: Effort normal.  Abdominal: Soft. She exhibits no distension. There is no tenderness.  Incision Healing Well   Musculoskeletal: Normal range of motion.  Neurological: She is alert and oriented to person, place, and time. No cranial nerve deficit.  Skin: Skin is warm and dry.  Psychiatric: She has a normal mood and affect.  OnQ removed today Normal lochia  Assessment: s/p :  cesarean section progressing well  Plan: Patient has done well after surgery with no apparent complications.  I have discussed the post-operative course to date, and the expected progress moving forward.  The patient understands what complications to be concerned about.  I will see the patient in routine follow up, or sooner if needed.    Activity plan: No heavy lifting. Pelvic rest Uncertain BC now VBAC vs CS next time discussed Bottle feeding  And stopping breast feeding  Letitia Libra 03/25/2018, 2:21 PM

## 2018-03-26 ENCOUNTER — Encounter: Payer: Self-pay | Admitting: Certified Nurse Midwife

## 2018-05-01 ENCOUNTER — Encounter: Payer: Self-pay | Admitting: Certified Nurse Midwife

## 2018-05-01 ENCOUNTER — Ambulatory Visit (INDEPENDENT_AMBULATORY_CARE_PROVIDER_SITE_OTHER): Payer: Managed Care, Other (non HMO) | Admitting: Certified Nurse Midwife

## 2018-05-01 DIAGNOSIS — G43109 Migraine with aura, not intractable, without status migrainosus: Secondary | ICD-10-CM | POA: Insufficient documentation

## 2018-05-01 NOTE — Progress Notes (Signed)
Postpartum Visit  Chief Complaint:  Chief Complaint  Patient presents with  . Postpartum Care    6 wk pp    History of Present Illness: Shelly Coleman is a 32 y.o. G68P1001 Asian female who  presents for her 6 week postpartum visit.  Date of delivery: 03/20/2018 Type of delivery: LTCS for FITL (deceleration to 50 nadir x 8 minutes with first push)    Pregnancy or labor problems:  Yes, obesity, hypothyroidism, migraine with aura, vitamin D deficiency, pubic bone pain Any problems since the delivery:  Yes, having some lower abdominal discomfort intermittently, and some pubic bone pain. Bleeding stopped about 3 weeks ago. Has been urinating without difficulty. Having BMs 2x/day-formed, no blood in stool.  Newborn Details:  SINGLETON :  1. Baby's name: Shelly Coleman. Birth weight: 7#9oz Maternal Details:  Breast Feeding:  No, stopped due to decreased milk production Post partum depression/anxiety noted:  Yes, some anxiety Edinburgh Post-Partum Depression Score:  not done  Date of last PAP: 07/16/2014  ASCUS with negative HRHPV  Review of Systems: ROS-see HPI  Past Medical History:  Past Medical History:  Diagnosis Date  . Migraine headache with aura   . Obesity (BMI 35.0-39.9 without comorbidity)   . Thyroid disease   . Vegetarian diet     Past Surgical History:  Past Surgical History:  Procedure Laterality Date  . CESAREAN SECTION N/A 03/20/2018   Procedure: CESAREAN SECTION- STAT;  Surgeon: Natale Milch, MD;  Location: [facility] ORS;  Service: Obstetrics;  Laterality: N/A;    Family History:  Family History  Problem Relation Age of Onset  . Leukemia Maternal Grandfather     Social History:  Social History   Socioeconomic History  . Marital status: Married    Spouse name: Binay  . Number of children: 0  . Years of education: Not on file  . Highest education level: Not on file  Occupational History  . Not on file  Social Needs  . Financial resource strain: Not  on file  . Food insecurity:    Worry: Not on file    Inability: Not on file  . Transportation needs:    Medical: Not on file    Non-medical: Not on file  Tobacco Use  . Smoking status: Never Smoker  . Smokeless tobacco: Never Used  Substance and Sexual Activity  . Alcohol use: No  . Drug use: No  . Sexual activity: Not Currently    Birth control/protection: None  Lifestyle  . Physical activity:    Days per week: Not on file    Minutes per session: Not on file  . Stress: Not on file  Relationships  . Social connections:    Talks on phone: Not on file    Gets together: Not on file    Attends religious service: Not on file    Active member of club or organization: Not on file    Attends meetings of clubs or organizations: Not on file    Relationship status: Not on file  . Intimate partner violence:    Fear of current or ex partner: Not on file    Emotionally abused: Not on file    Physically abused: Not on file    Forced sexual activity: Not on file  Other Topics Concern  . Not on file  Social History Narrative  . Not on file    Allergies:  No Known Allergies  Medications: Prior to Admission medications   Medication Sig Start  Date End Date Taking? Authorizing Provider  Cholecalciferol (VITAMIN D3) 5000 units CAPS Take 1 capsule by mouth daily.    [provider]  ferrous sulfate 325 (65 FE) MG tablet Take by mouth.    [provider]  levothyroxine (SYNTHROID, LEVOTHROID) 50 MCG tablet Take 50 mcg by mouth daily. Take two tablets (100 mcg) on Sundays. 03/30/17   [provider]  Misc. Devices (BREAST PUMP) MISC Dispense one breast pump for patient 02/21/18   Farrel ConnersGutierrez, Danyetta Gillham, CNM       Oswaldo ConroySchmid, Jacelyn Y, CNM  Prenatal Vit-Fe Fumarate-FA (MULTIVITAMIN-PRENATAL) 27-0.8 MG TABS tablet Take 1 tablet by mouth daily at 12 noon.    [provider]    Physical Exam Vitals: BP 110/70   Pulse 82   Ht 5\' 4"  (1.626 m)   Wt 218 lb (98.9 kg)    Breastfeeding? No   BMI 37.42 kg/m   General: NAD HEENT: normocephalic, anicteric Neck: No thyroid enlargement, no palpable nodules, no cervical lymphadenpathy Breast: no inflammation, no masses, nipples intact Pulmonary: No increased work of breathing, CTAB Heart: RRR without murmur Abdomen: Soft, non-tender, non-distended.  Umbilicus without lesions.  No hepatomegaly or masses palpable. No evidence of hernia. Genitourinary:  External: no lesions or inflammation    Vagina: Normal vaginal mucosa, no evidence of prolapse.    Cervix: closed, no bleeding  Uterus: Well involuted, mobile, non-tender  Adnexa: No adnexal masses, non-tender  Rectal: deferred Extremities: no edema, erythema, or tenderness Neurologic: Grossly intact Psychiatric: mood appropriate, affect full  Assessment: 32 y.o. G1P1001 presenting for 6 week postpartum visit  Plan:  1) Contraception Education given regarding options for contraception, including barrier methods, injectable contraception, IUD placement, oral contraceptives, Nexplanon. Patient would like to use condoms for contraception.  2)  Pap declined today  Patient desires to return to office in 1 month for Pap  3) Need Inocente [place] when she returns for Pap  4) Discussed return to normal activity, recommend continuing vitamins. Discussed increasing activity in her desire to lose weight, but to postpone abdominal exercises until incision a little more healed. Follow up with endocrinology as scheduled.  Farrel Connersolleen Mouhamad Teed, CNM

## 2018-05-05 ENCOUNTER — Encounter: Payer: Self-pay | Admitting: Certified Nurse Midwife

## 2018-05-21 LAB — VITAMIN D 25 HYDROXY (VIT D DEFICIENCY, FRACTURES): Vit D, 25-Hydroxy: 31.2

## 2018-05-26 DIAGNOSIS — D509 Iron deficiency anemia, unspecified: Secondary | ICD-10-CM | POA: Insufficient documentation

## 2018-06-11 ENCOUNTER — Encounter: Payer: Self-pay | Admitting: Certified Nurse Midwife

## 2018-06-13 ENCOUNTER — Encounter: Payer: Self-pay | Admitting: Obstetrics & Gynecology

## 2018-06-14 ENCOUNTER — Encounter: Payer: Self-pay | Admitting: Certified Nurse Midwife

## 2018-06-14 ENCOUNTER — Ambulatory Visit (INDEPENDENT_AMBULATORY_CARE_PROVIDER_SITE_OTHER): Payer: Managed Care, Other (non HMO) | Admitting: Certified Nurse Midwife

## 2018-06-14 VITALS — BP 100/70 | HR 78 | Ht 64.0 in | Wt 218.5 lb

## 2018-06-14 DIAGNOSIS — T8149XA Infection following a procedure, other surgical site, initial encounter: Secondary | ICD-10-CM

## 2018-06-14 DIAGNOSIS — T8141XA Infection following a procedure, superficial incisional surgical site, initial encounter: Secondary | ICD-10-CM

## 2018-06-14 NOTE — Telephone Encounter (Signed)
Please advise 

## 2018-06-14 NOTE — Progress Notes (Signed)
Obstetrics & Gynecology Office Visit   Chief Complaint:  Chief Complaint  Patient presents with  . Wound Check    burning sensation; spot bleeding the last two days    History of Present Illness: 32 yo G1 P001 s/p LTCS for FITL  On 03/20/2018 presents at 12 weeks postpartum with complaints of a tender draining area on her Pfannenstiel incision x 1-2 days. Denies fever or chills. The drainage appears pink tinged and the area burns.   Review of Systems:  ROS   Past Medical History:  Past Medical History:  Diagnosis Date  . Migraine headache with aura   . Obesity (BMI 35.0-39.9 without comorbidity)   . Thyroid disease   . Vegetarian diet     Past Surgical History:  Past Surgical History:  Procedure Laterality Date  . CESAREAN SECTION N/A 03/20/2018   Procedure: CESAREAN SECTION- STAT;  Surgeon: Natale MilchSchuman, Christanna R, MD;  Location: [facility] ORS;  Service: Obstetrics;  Laterality: N/A;    Gynecologic History: Patient's last menstrual period was 05/14/2018.  Obstetric History: G1P1001  Family History:  Family History  Problem Relation Age of Onset  . Leukemia Maternal Grandfather     Social History:  Social History   Socioeconomic History  . Marital status: Married    Spouse name: Binay  . Number of children: 1  . Years of education: Not on file  . Highest education level: Not on file  Occupational History  . Not on file  Social Needs  . Financial resource strain: Not on file  . Food insecurity:    Worry: Not on file    Inability: Not on file  . Transportation needs:    Medical: Not on file    Non-medical: Not on file  Tobacco Use  . Smoking status: Never Smoker  . Smokeless tobacco: Never Used  Substance and Sexual Activity  . Alcohol use: No  . Drug use: No  . Sexual activity: Not Currently    Birth control/protection: None  Lifestyle  . Physical activity:    Days per week: Not on file    Minutes per session: Not on file  . Stress: Not on file    Relationships  . Social connections:    Talks on phone: Not on file    Gets together: Not on file    Attends religious service: Not on file    Active member of club or organization: Not on file    Attends meetings of clubs or organizations: Not on file    Relationship status: Not on file  . Intimate partner violence:    Fear of current or ex partner: Not on file    Emotionally abused: Not on file    Physically abused: Not on file    Forced sexual activity: Not on file  Other Topics Concern  . Not on file  Social History Narrative  . Not on file    Allergies:  No Known Allergies  Medications: Prior to Admission medications   Medication Sig Start Date End Date Taking? Authorizing Provider  levothyroxine (SYNTHROID, LEVOTHROID) 50 MCG tablet Take 1 tablet by mouth daily. 05/03/18  Yes [provider]  acetaminophen (TYLENOL) 500 MG tablet Take 500 mg by mouth every 6 (six) hours as needed.    [provider]  Cholecalciferol (VITAMIN D3) 5000 units CAPS Take 1 capsule by mouth daily.    [provider]  Prenatal Vit-Fe Fumarate-FA (MULTIVITAMIN-PRENATAL) 27-0.8 MG TABS tablet Take 1 tablet by mouth  daily at 12 noon.    [provider]    Physical Exam Vitals:BP 100/70   Pulse 78   Ht 5\' 4"  (1.626 m)   Wt 218 lb 8 oz (99.1 kg)   LMP 05/14/2018   BMI 37.51 kg/m  Patient's last menstrual period was 05/14/2018.  Physical Exam  Constitutional: She is oriented to person, place, and time. She appears well-developed and well-nourished. No distress.  Cardiovascular: Normal rate.  Respiratory: Effort normal.  GI:  Obese, soft, lower abdominal incision is not inflamed.  Just to the left of midline there is a pinpoint opening draining a clear serous drainage. No mass palpated under the skin. Silver nitrite applied to this opening which was about 4-5 mm deep.   Neurological: She is alert and oriented to person, place, and time.  Skin: Skin is warm  and dry.  Psychiatric:  anxious     Assessment: 32 y.o. G1P1001 12 weeks postop Cesarean section with stitch abscess. Plan: Discussed POM with Dr Bonney Aid. As per his instructions, will have husband apply silver nitrate every 1-2 days and have patient return in 1 week for follow up. OK to apply Neosporin ointment to this area.

## 2018-06-21 ENCOUNTER — Encounter: Payer: Self-pay | Admitting: Certified Nurse Midwife

## 2018-06-21 ENCOUNTER — Ambulatory Visit (INDEPENDENT_AMBULATORY_CARE_PROVIDER_SITE_OTHER): Payer: Managed Care, Other (non HMO) | Admitting: Certified Nurse Midwife

## 2018-06-21 VITALS — BP 100/60 | HR 75 | Ht 64.0 in | Wt 218.0 lb

## 2018-06-21 DIAGNOSIS — T8141XA Infection following a procedure, superficial incisional surgical site, initial encounter: Secondary | ICD-10-CM

## 2018-06-21 DIAGNOSIS — T8149XA Infection following a procedure, other surgical site, initial encounter: Secondary | ICD-10-CM | POA: Diagnosis not present

## 2018-06-22 NOTE — Progress Notes (Signed)
Obstetrics & Gynecology Office Visit   Chief Complaint:  Chief Complaint  Patient presents with  . Postpartum Follow-up    Wound still oozing, itching, slight redness    History of Present Illness: 32 yo G1 P001 s/p LTCS for FITL  On 03/20/2018 presents at 13 weeks postpartum for a follow up visit regarding her stitch abscess involving her Pfannenstiel incision. She was being treated with silver nitrate at home, ordered every other day. Husband reports that after the second application, some of the silver nitrate leaked out of the opening and irritated the rest of the scar. They did not apply any more silver nitrate. Have been applying Neosporin to the scar area.    Review of Systems:  ROS   Past Medical History:  Past Medical History:  Diagnosis Date  . Migraine headache with aura   . Obesity (BMI 35.0-39.9 without comorbidity)   . Thyroid disease   . Vegetarian diet     Past Surgical History:  Past Surgical History:  Procedure Laterality Date  . CESAREAN SECTION N/A 03/20/2018   Procedure: CESAREAN SECTION- STAT;  Surgeon: Natale Milch, MD;  Location: [facility] ORS;  Service: Obstetrics;  Laterality: N/A;    Gynecologic History: Patient's last menstrual period was 06/20/2018.  Obstetric History: G1P1001  Family History:  Family History  Problem Relation Age of Onset  . Leukemia Maternal Grandfather     Social History:  Social History   Socioeconomic History  . Marital status: Married    Spouse name: Binay  . Number of children: 1  . Years of education: Not on file  . Highest education level: Not on file  Occupational History  . Not on file  Social Needs  . Financial resource strain: Not on file  . Food insecurity:    Worry: Not on file    Inability: Not on file  . Transportation needs:    Medical: Not on file    Non-medical: Not on file  Tobacco Use  . Smoking status: Never Smoker  . Smokeless tobacco: Never Used  Substance and Sexual Activity  .  Alcohol use: No  . Drug use: No  . Sexual activity: Not Currently    Birth control/protection: None  Lifestyle  . Physical activity:    Days per week: Not on file    Minutes per session: Not on file  . Stress: Not on file  Relationships  . Social connections:    Talks on phone: Not on file    Gets together: Not on file    Attends religious service: Not on file    Active member of club or organization: Not on file    Attends meetings of clubs or organizations: Not on file    Relationship status: Not on file  . Intimate partner violence:    Fear of current or ex partner: Not on file    Emotionally abused: Not on file    Physically abused: Not on file    Forced sexual activity: Not on file  Other Topics Concern  . Not on file  Social History Narrative  . Not on file    Allergies:  No Known Allergies  Medications: Prior to Admission medications   Medication Sig Start Date End Date Taking? Authorizing Provider  levothyroxine (SYNTHROID, LEVOTHROID) 50 MCG tablet Take 1 tablet by mouth daily. 05/03/18  Yes [provider]  acetaminophen (TYLENOL) 500 MG tablet Take 500 mg by mouth every 6 (six) hours as needed.  [provider]  Cholecalciferol (VITAMIN D3) 5000 units CAPS Take 1 capsule by mouth daily.    [provider]  Prenatal Vit-Fe Fumarate-FA (MULTIVITAMIN-PRENATAL) 27-0.8 MG TABS tablet Take 1 tablet by mouth daily at 12 noon.    [provider]    Physical Exam Vitals:BP 100/60 (BP Location: Right Arm, Patient Position: Sitting, Cuff Size: Normal)   Pulse 75   Ht 5\' 4"  (1.626 m)   Wt 218 lb (98.9 kg)   LMP 06/20/2018   Breastfeeding? No   BMI 37.42 kg/m  Patient's last menstrual period was 06/20/2018.  Physical Exam  Constitutional: She is oriented to person, place, and time. She appears well-developed and well-nourished. No distress.  Cardiovascular: Normal rate.  Respiratory: Effort normal.  GI:  Obese, soft, lower  abdominal incision is not inflamed.  Just to the left of midline there is a 4-5 mm opening. This opening is not as deep as last week. No drainage is seen coming from the opening. Below this opening, the skin is pink and healing.   No mass palpated under the skin. Silver nitrite applied to the opening which was about 2-3 mm deep.   Neurological: She is alert and oriented to person, place, and time.  Skin: Skin is warm and dry.  Psychiatric:  anxious     Assessment: 32 y.o. G1P1001 13 weeks postop with a healing stitch abscess. Plan: Will stop applying silver nitrate at home. Advised patient to keep area clean and dry and covered Can shower Follow up in 1 week  Farrel Connersolleen Darrelyn Morro, CNM

## 2018-06-27 ENCOUNTER — Encounter: Payer: Self-pay | Admitting: Certified Nurse Midwife

## 2018-06-27 ENCOUNTER — Ambulatory Visit (INDEPENDENT_AMBULATORY_CARE_PROVIDER_SITE_OTHER): Payer: Managed Care, Other (non HMO) | Admitting: Certified Nurse Midwife

## 2018-06-27 VITALS — BP 110/80 | HR 78 | Ht 64.0 in | Wt 217.2 lb

## 2018-06-27 DIAGNOSIS — T8149XA Infection following a procedure, other surgical site, initial encounter: Secondary | ICD-10-CM

## 2018-06-27 DIAGNOSIS — T8141XA Infection following a procedure, superficial incisional surgical site, initial encounter: Secondary | ICD-10-CM

## 2018-06-27 NOTE — Progress Notes (Signed)
Obstetrics & Gynecology Office Visit   Chief Complaint:  Chief Complaint  Patient presents with  . Follow-up    History of Present Illness: 32 yo G1 P001 s/p LTCS for FITL on 03/20/2018 presents at 14 weeks postpartum for a follow up visit regarding her stitch abscess involving her Pfannenstiel incision. She was being treated initially by applying  silver nitrate to the defect, however her skin below the lower incision was burned. Over the last week she has just kept the area covered and dry. Has noticed a little pink drainage from the area.   Review of Systems:  ROS   Past Medical History:  Past Medical History:  Diagnosis Date  . Migraine headache with aura   . Obesity (BMI 35.0-39.9 without comorbidity)   . Thyroid disease    hypothyroidism  . Vegetarian diet     Past Surgical History:  Past Surgical History:  Procedure Laterality Date  . CESAREAN SECTION N/A 03/20/2018   Procedure: CESAREAN SECTION- STAT;  Surgeon: Natale MilchSchuman, Christanna R, MD;  Location: [facility] ORS;  Service: Obstetrics;  Laterality: N/A;  . NO PAST SURGERIES      Gynecologic History: Patient's last menstrual period was 06/20/2018.  Obstetric History: G1P1001  Family History:  Family History  Problem Relation Age of Onset  . Leukemia Maternal Grandfather   . Hypertension Father [id number]  . Diabetes Father 8245  . Diabetes Paternal Grandmother 7168  . Hypertension Paternal Grandfather 1540  . Diabetes Paternal Grandfather 4840    Social History:  Social History   Socioeconomic History  . Marital status: Married    Spouse name: Binay  . Number of children: 1  . Years of education: 4418  . Highest education level: Not on file  Occupational History  . Not on file  Social Needs  . Financial resource strain: Not on file  . Food insecurity:    Worry: Not on file    Inability: Not on file  . Transportation needs:    Medical: Not on file    Non-medical: Not on file  Tobacco Use  . Smoking status: Never  Smoker  . Smokeless tobacco: Never Used  Substance and Sexual Activity  . Alcohol use: Yes  . Drug use: No  . Sexual activity: Not Currently    Birth control/protection: None  Lifestyle  . Physical activity:    Days per week: Not on file    Minutes per session: Not on file  . Stress: Not on file  Relationships  . Social connections:    Talks on phone: Not on file    Gets together: Not on file    Attends religious service: Not on file    Active member of club or organization: Not on file    Attends meetings of clubs or organizations: Not on file    Relationship status: Not on file  . Intimate partner violence:    Fear of current or ex partner: Not on file    Emotionally abused: Not on file    Physically abused: Not on file    Forced sexual activity: Not on file  Other Topics Concern  . Not on file  Social History Narrative  . Not on file    Allergies:  No Known Allergies  Medications: Prior to Admission medications   Medication Sig Start Date End Date Taking? Authorizing Provider  levothyroxine (SYNTHROID, LEVOTHROID) 50 MCG tablet Take 1 tablet by mouth daily. 05/03/18  Yes [provider]  acetaminophen (TYLENOL) 500  MG tablet Take 500 mg by mouth every 6 (six) hours as needed.    [provider]  Cholecalciferol (VITAMIN D3) 5000 units CAPS Take 1 capsule by mouth daily.    [provider]  Prenatal Vit-Fe Fumarate-FA (MULTIVITAMIN-PRENATAL) 27-0.8 MG TABS tablet Take 1 tablet by mouth daily at 12 noon.    [provider]    Physical Exam Vitals:BP 110/80   Pulse 78   Ht 5\' 4"  (1.626 m)   Wt 217 lb 4 oz (98.5 kg)   LMP 06/20/2018   BMI 37.29 kg/m  Patient's last menstrual period was 06/20/2018.  Physical Exam  Constitutional: She is oriented to person, place, and time. She appears well-developed and well-nourished. No distress.  Cardiovascular: Normal rate.  Respiratory: Effort normal.  GI:  Obese, soft, lower abdominal  incision is not inflamed.  Just to the left of midline there is a 4-5 mm opening. This opening is more shallow then last week. No drainage is seen coming from the opening.    Neurological: She is alert and oriented to person, place, and time.  Skin: Skin is warm and dry.  Psychiatric:  anxious     Assessment: 32 y.o. G1P1001 14 weeks postop with a healing stitch abscess. Plan: Applied steristrips to this defect. Advised patient to keep area clean and dry and covere Follow up in 1-2 weeks  Farrel Connersolleen Alekxander Isola, CNM

## 2018-07-08 ENCOUNTER — Encounter: Payer: Self-pay | Admitting: Certified Nurse Midwife

## 2018-07-08 ENCOUNTER — Ambulatory Visit (INDEPENDENT_AMBULATORY_CARE_PROVIDER_SITE_OTHER): Payer: Managed Care, Other (non HMO) | Admitting: Certified Nurse Midwife

## 2018-07-08 VITALS — BP 124/74 | HR 66 | Ht 64.0 in | Wt 219.8 lb

## 2018-07-08 DIAGNOSIS — T8141XA Infection following a procedure, superficial incisional surgical site, initial encounter: Secondary | ICD-10-CM

## 2018-07-08 DIAGNOSIS — T8149XA Infection following a procedure, other surgical site, initial encounter: Secondary | ICD-10-CM | POA: Diagnosis not present

## 2018-07-08 NOTE — Progress Notes (Signed)
Obstetrics & Gynecology Office Visit   Chief Complaint:  Chief Complaint  Patient presents with  . Follow-up    incision - still sees a blood stain on gauze    History of Present Illness: 32 yo G1 P001 s/p LTCS for FITL on 03/20/2018 presents at 15-16 weeks postpartum for a follow up visit regarding her stitch abscess involving her Pfannenstiel incision. She was being treated initially by applying  silver nitrate to the defect, however her skin below the lower incision was burned with the silver nitrate. Over the last several weeks she has kept the area covered and the skin area that was burned had healed and the wound defect was granulating in. At her last visit 1-2 weeks ago a steristrip was applied to the skin, but it fell off quickly.     Review of Systems:  Review of Systems  Constitutional: Positive for malaise/fatigue. Negative for chills, fever and weight loss.  HENT: Negative for congestion, sinus pain and sore throat.   Eyes: Negative for blurred vision and pain.  Respiratory: Negative for hemoptysis, shortness of breath and wheezing.   Cardiovascular: Negative for chest pain, palpitations and leg swelling.  Gastrointestinal: Negative for abdominal pain, blood in stool, diarrhea, heartburn, nausea and vomiting.  Genitourinary: Negative for dysuria, frequency, hematuria and urgency.  Musculoskeletal: Negative for back pain, joint pain and myalgias.  Skin: Negative for itching and rash.  Neurological: Negative for dizziness, tingling and headaches.  Endo/Heme/Allergies: Negative for environmental allergies and polydipsia. Does not bruise/bleed easily.       Negative for hirsutism   Psychiatric/Behavioral: Negative for depression. The patient is not nervous/anxious and does not have insomnia.      Past Medical History:  Past Medical History:  Diagnosis Date  . Migraine headache with aura   . Obesity (BMI 35.0-39.9 without comorbidity)   . Thyroid disease    hypothyroidism  . Vegetarian diet     Past Surgical History:  Past Surgical History:  Procedure Laterality Date  . CESAREAN SECTION N/A 03/20/2018   Procedure: CESAREAN SECTION- STAT;  Surgeon: Natale Milch, MD;  Location: [facility] ORS;  Service: Obstetrics;  Laterality: N/A;    Gynecologic History: Patient's last menstrual period was 06/20/2018.  Obstetric History: G1P1001  Family History:  Family History  Problem Relation Age of Onset  . Leukemia Maternal Grandfather   . Hypertension Father 56  . Diabetes Father 7  . Diabetes Paternal Grandmother 45  . Hypertension Paternal Grandfather 53  . Diabetes Paternal Grandfather 25    Social History:  Social History   Socioeconomic History  . Marital status: Married    Spouse name: Binay  . Number of children: 1  . Years of education: 61  . Highest education level: Not on file  Occupational History  . Not on file  Social Needs  . Financial resource strain: Not on file  . Food insecurity:    Worry: Not on file    Inability: Not on file  . Transportation needs:    Medical: Not on file    Non-medical: Not on file  Tobacco Use  . Smoking status: Never Smoker  . Smokeless tobacco: Never Used  Substance and Sexual Activity  . Alcohol use: Yes  . Drug use: No  . Sexual activity: Not Currently    Birth control/protection: None  Lifestyle  . Physical activity:    Days per week: Not on file    Minutes per session: Not on file  .  Stress: Not on file  Relationships  . Social connections:    Talks on phone: Not on file    Gets together: Not on file    Attends religious service: Not on file    Active member of club or organization: Not on file    Attends meetings of clubs or organizations: Not on file    Relationship status: Not on file  . Intimate partner violence:    Fear of current or ex partner: Not on file    Emotionally abused: Not on file    Physically abused: Not on file    Forced sexual activity: Not on  file  Other Topics Concern  . Not on file  Social History Narrative  . Not on file    Allergies:  No Known Allergies  Medications: Prior to Admission medications   Medication Sig Start Date End Date Taking? Authorizing Provider  levothyroxine (SYNTHROID, LEVOTHROID) 50 MCG tablet Take 1 tablet by mouth daily. 05/03/18  Yes [provider]  acetaminophen (TYLENOL) 500 MG tablet Take 500 mg by mouth every 6 (six) hours as needed.    [provider]  Cholecalciferol (VITAMIN D3) 5000 units CAPS Take 1 capsule by mouth daily.    [provider]  Prenatal Vit-Fe Fumarate-FA (MULTIVITAMIN-PRENATAL) 27-0.8 MG TABS tablet Take 1 tablet by mouth daily at 12 noon.    [provider]    Physical Exam Vitals:BP 124/74   Pulse 66   Ht 5\' 4"  (1.626 m)   Wt 219 lb 12 oz (99.7 kg)   LMP 06/20/2018   BMI 37.72 kg/m  Patient's last menstrual period was 06/20/2018.  Physical Exam  Constitutional: She is oriented to person, place, and time. She appears well-developed and well-nourished. No distress.  Cardiovascular: Normal rate.  Respiratory: Effort normal.  GI:  Obese, soft, lower abdominal incision is not inflamed.  Just to the left of midline there is a 4-5 mm opening. This opening is more shallow then last visit. No drainage is seen coming from the opening.  The skin edges approximate well with no tension on the skin.  Neurological: She is alert and oriented to person, place, and time.  Skin: Skin is warm and dry.  Psychiatric:  anxious     Assessment: 32 y.o. G1P1001 14 weeks postop with a healing stitch abscess. Plan: Advised patient to apply Neosporin and keep covered with a bandaid. Follow up in 2 weeks  Farrel Connersolleen Addie Cederberg, CNM

## 2018-07-22 ENCOUNTER — Ambulatory Visit (INDEPENDENT_AMBULATORY_CARE_PROVIDER_SITE_OTHER): Payer: Managed Care, Other (non HMO) | Admitting: Certified Nurse Midwife

## 2018-07-22 ENCOUNTER — Encounter: Payer: Self-pay | Admitting: Certified Nurse Midwife

## 2018-07-22 VITALS — BP 100/58 | HR 67 | Ht 64.0 in | Wt 216.0 lb

## 2018-07-22 DIAGNOSIS — L739 Follicular disorder, unspecified: Secondary | ICD-10-CM

## 2018-07-22 DIAGNOSIS — T8149XA Infection following a procedure, other surgical site, initial encounter: Secondary | ICD-10-CM

## 2018-07-22 DIAGNOSIS — T8141XA Infection following a procedure, superficial incisional surgical site, initial encounter: Secondary | ICD-10-CM

## 2018-07-22 DIAGNOSIS — L089 Local infection of the skin and subcutaneous tissue, unspecified: Secondary | ICD-10-CM

## 2018-07-22 MED ORDER — DOXYCYCLINE HYCLATE 100 MG PO CAPS: 100.0000 mg | ORAL_CAPSULE | Freq: Two times a day (BID) | ORAL | 0 refills | Status: AC

## 2018-07-22 NOTE — Progress Notes (Signed)
Obstetrics & Gynecology Office Visit   Chief Complaint:  Chief Complaint  Patient presents with  . Follow-up    Incision check    History of Present Illness: 32 yo G1 P001 s/p LTCS for FITL on 03/20/2018 presents at 17-18 weeks postpartum for a follow up visit regarding her stitch abscess involving her Pfannenstiel incision. She was being treated initially by applying  silver nitrate to the defect, however her skin below the lower incision was burned with the silver nitrate. Over the last several weeks she has kept the area covered and the skin area that was burned had healed and the wound defect was granulating in.  Shelly Coleman also complains of tender bumps on her inner thighs that have worsened over the past week.    Review of Systems:  Review of Systems  Constitutional: Positive for malaise/fatigue. Negative for chills, fever and weight loss.  HENT: Negative for congestion, sinus pain and sore throat.   Eyes: Negative for blurred vision and pain.  Respiratory: Negative for hemoptysis, shortness of breath and wheezing.   Cardiovascular: Negative for chest pain, palpitations and leg swelling.  Gastrointestinal: Negative for abdominal pain, blood in stool, diarrhea, heartburn, nausea and vomiting.  Genitourinary: Negative for dysuria, frequency, hematuria and urgency.  Musculoskeletal: Negative for back pain, joint pain and myalgias.  Skin: Positive for rash (inner thighs). Negative for itching.  Neurological: Negative for dizziness, tingling and headaches.  Endo/Heme/Allergies: Negative for environmental allergies and polydipsia. Does not bruise/bleed easily.       Negative for hirsutism   Psychiatric/Behavioral: Negative for depression. The patient is not nervous/anxious and does not have insomnia.      Past Medical History:  Past Medical History:  Diagnosis Date  . Migraine headache with aura   . Obesity (BMI 35.0-39.9 without comorbidity)   . Thyroid disease    hypothyroidism   . Vegetarian diet     Past Surgical History:  Past Surgical History:  Procedure Laterality Date  . CESAREAN SECTION N/A 03/20/2018   Procedure: CESAREAN SECTION- STAT;  Surgeon: Natale Milch, MD;  Location: [facility] OR[facility];  Service: Obstetrics;  Laterality: N/A;    Gynecologic History: Patient's last menstrual period was 06/20/2018.  Obstetric History: G1P1001  Family History:  Family History  Problem Relation Age of Onset  . Leukemia Maternal Grandfather   . Hypertension Father 17  . Diabetes Father 54  . Diabetes Paternal Grandmother 46  . Hypertension Paternal Grandfather 39  . Diabetes Paternal Grandfather 42    Social History:  Social History   Socioeconomic History  . Marital status: Married    Spouse name: Binay  . Number of children: 1  . Years of education: 57  . Highest education level: Not on file  Occupational History  . Not on file  Social Needs  . Financial resource strain: Not on file  . Food insecurity:    Worry: Not on file    Inability: Not on file  . Transportation needs:    Medical: Not on file    Non-medical: Not on file  Tobacco Use  . Smoking status: Never Smoker  . Smokeless tobacco: Never Used  Substance and Sexual Activity  . Alcohol use: Yes  . Drug use: No  . Sexual activity: Not Currently    Birth control/protection: None  Lifestyle  . Physical activity:    Days per week: Not on file    Minutes per session: Not on file  . Stress: Not on file  Relationships  . Social connections:    Talks on phone: Not on file    Gets together: Not on file    Attends religious service: Not on file    Active member of club or organization: Not on file    Attends meetings of clubs or organizations: Not on file    Relationship status: Not on file  . Intimate partner violence:    Fear of current or ex partner: Not on file    Emotionally abused: Not on file    Physically abused: Not on file    Forced sexual activity: Not on file  Other  Topics Concern  . Not on file  Social History Narrative  . Not on file    Allergies:  No Known Allergies  Medications: Prior to Admission medications   Medication Sig Start Date End Date Taking? Authorizing Provider  levothyroxine (SYNTHROID, LEVOTHROID) 50 MCG tablet Take 1 tablet by mouth daily. 05/03/18  Yes [provider]  acetaminophen (TYLENOL) 500 MG tablet Take 500 mg by mouth every 6 (six) hours as needed.    [provider]  Cholecalciferol (VITAMIN D3) 5000 units CAPS Take 1 capsule by mouth daily.    [provider]  Prenatal Vit-Fe Fumarate-FA (MULTIVITAMIN-PRENATAL) 27-0.8 MG TABS tablet Take 1 tablet by mouth daily at 12 noon.    [provider]    Physical Exam Vitals:BP (!) 100/58 (BP Location: Right Arm, Patient Position: Sitting, Cuff Size: Normal)   Pulse 67   Ht 5\' 4"  (1.626 m)   Wt 216 lb (98 kg)   LMP 06/20/2018   Breastfeeding? No   BMI 37.08 kg/m  Patient's last menstrual period was 06/20/2018.  Physical Exam  Constitutional: She is oriented to person, place, and time. She appears well-developed and well-nourished. No distress.  Cardiovascular: Normal rate.  Respiratory: Effort normal.  GI:  Obese, soft. The lower abdominal incision defect has now granulated in and has healed completely  Neurological: She is alert and oriented to person, place, and time.  Skin: Skin is warm and dry.  On inner thighs there are pustules in various stages of healing. There is a boil >1 cm large on the upper right thigh. No drainage seen coming from any of these pustules  Psychiatric:  anxious     Assessment: 32 y.o. G1P1001 4.5 months postop- incisional stitch abscess, totally healed Folliculitis of inner thighs?  Plan: Vibramycin 100 mgm BID x 10 days Avoid exposure to prolonged sun while on antibiotics If no improvement, would recommend seeing a dermatologist.  Farrel Conners, CNM

## 2018-08-20 ENCOUNTER — Ambulatory Visit (INDEPENDENT_AMBULATORY_CARE_PROVIDER_SITE_OTHER): Payer: Managed Care, Other (non HMO) | Admitting: Certified Nurse Midwife

## 2018-08-20 ENCOUNTER — Encounter: Payer: Self-pay | Admitting: Certified Nurse Midwife

## 2018-08-20 VITALS — BP 120/70 | HR 72 | Ht 64.0 in | Wt 211.0 lb

## 2018-08-20 DIAGNOSIS — N764 Abscess of vulva: Secondary | ICD-10-CM

## 2018-08-20 MED ORDER — DOXYCYCLINE HYCLATE 100 MG PO CAPS: 100.0000 mg | ORAL_CAPSULE | Freq: Two times a day (BID) | ORAL | 0 refills | Status: AC

## 2018-08-20 NOTE — Patient Instructions (Signed)
Hibiclens or chlorhexadiene soap

## 2018-08-20 NOTE — Progress Notes (Signed)
Obstetrics & Gynecology Office Visit   Chief Complaint:  Chief Complaint  Patient presents with  . Gynecologic Exam    BOIL on side of vagina    History of Present Illness: 32 year old G1 P1 now 5 mos postpartum who presents with a boil on the right labia majora. 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 This lesion was first noted yesterday and this AM it started to drain. The lesion is tender. She has stopped shaving in this area due to recurrent boils.  She is bottle feeding. Her LMP was 07/23/2018   Past history is significant for obesity, hypothyroidism, and vitamin D deficiency. She is a vegetarian Review of Systems:  ROS -see HPI  Past Medical History:  Past Medical History:  Diagnosis Date  . Migraine headache with aura   . Obesity (BMI 35.0-39.9 without comorbidity)   . Thyroid disease    hypothyroidism  . Vegetarian diet     Past Surgical History:  Past Surgical History:  Procedure Laterality Date  . CESAREAN SECTION N/A 03/20/2018   Procedure: CESAREAN SECTION- STAT;  Surgeon: Natale Milch, MD;  Location: [facility] ORS;  Service: Obstetrics;  Laterality: N/A;    Gynecologic History: Patient's last menstrual period was 07/23/2018.  Obstetric History: G1P1001  Family History:  Family History  Problem Relation Age of Onset  . Leukemia Maternal Grandfather   . Hypertension Father 33  . Diabetes Father 26  . Diabetes Paternal Grandmother 30  . Hypertension Paternal Grandfather 44  . Diabetes Paternal Grandfather 37    Social History:  Social History   Socioeconomic History  . Marital status: Married    Spouse name: Binay  . Number of children: 1  . Years of education: 48  . Highest education level: Not on file  Occupational History  . Occupation: unemployed  Social Needs  . Financial resource strain: Not on file  . Food insecurity:    Worry: Not  on file    Inability: Not on file  . Transportation needs:    Medical: Not on file    Non-medical: Not on file  Tobacco Use  . Smoking status: Never Smoker  . Smokeless tobacco: Never Used  Substance and Sexual Activity  . Alcohol use: Yes    Comment: rarely  . Drug use: No  . Sexual activity: Not Currently    Birth control/protection: None  Lifestyle  . Physical activity:    Days per week: Not on file    Minutes per session: Not on file  . Stress: Not on file  Relationships  . Social connections:    Talks on phone: Not on file    Gets together: Not on file    Attends religious service: Not on file    Active member of club or organization: Not on file    Attends meetings of clubs or organizations: Not on file    Relationship status: Not on file  . Intimate partner violence:    Fear of current or ex partner: Not on file    Emotionally abused: Not on file    Physically abused: Not on file    Forced sexual activity: Not on file  Other Topics Concern  . Not on file  Social History Narrative  . Not on file    Allergies:  No Known Allergies  Medications: Prior to Admission medications   Medication  Sig Start Date End Date Taking? Authorizing Provider  acetaminophen (TYLENOL) 500 MG tablet Take 500 mg by mouth every 6 (six) hours as needed.   Yes [provider]  Cholecalciferol (VITAMIN D3) 5000 units CAPS Take 1 capsule by mouth daily.   Yes [provider]  levothyroxine (SYNTHROID, LEVOTHROID) 50 MCG tablet Take 1 tablet by mouth daily. 05/03/18  Yes [provider]  Multiple Vitamin (MULTIVITAMIN WITH MINERALS) TABS tablet Take 1 tablet by mouth daily.   Yes [provider]  Multiple Vitamin (MULTIVITAMIN WITH MINERALS) TABS tablet Take 1 tablet by mouth daily.   Yes [provider]  Prenatal Vit-Fe Fumarate-FA (MULTIVITAMIN-PRENATAL) 27-0.8 MG TABS tablet Take 1 tablet by mouth daily at 12 noon.    [provider]     Physical Exam Vitals: BP 120/70   Pulse 72   Ht 5\' 4"  (1.626 m)   Wt 211 lb (95.7 kg)   LMP 07/23/2018   BMI 36.22 kg/m      Physical Exam  Constitutional: She is oriented to person, place, and time. She appears well-developed and well-nourished. No distress.  Cardiovascular: Normal rate.  Respiratory: Effort normal.  Genitourinary:  Genitourinary Comments: External/ BUS: mid right labia majora is a 2-3 cm boil with surrounding induration and a central area where she is draining a small amount of off white drainage. Aerobic culture done  Neurological: She is alert and oriented to person, place, and time.  Psychiatric: She has a normal mood and affect.     Assessment: 32 y.o. G1P1001 right labial abscess/boil  Plan: Warm soaks to area Doxycycline 100 mgm BID x 7days Aerobic culture sent RTO prn persistent symptoms  Wash labia with antibacterial soap.  Farrel Conners, CNM

## 2018-08-24 LAB — GENITAL CULTURE

## 2018-08-24 LAB — SPECIMEN STATUS REPORT

## 2018-08-26 ENCOUNTER — Telehealth: Payer: Self-pay | Admitting: Certified Nurse Midwife

## 2018-08-26 DIAGNOSIS — L0293 Carbuncle, unspecified: Secondary | ICD-10-CM

## 2018-08-26 NOTE — Telephone Encounter (Signed)
Called patient with culture results. Culture showed normal genital bacteria. No MRSA. She reports that her boil is reducing in size and is less tender. Would like dermatology consult for recurrent boils on her labia. Will refer to [facility]. Farrel Conners, CNM

## 2019-01-31 DIAGNOSIS — E038 Other specified hypothyroidism: Secondary | ICD-10-CM | POA: Insufficient documentation

## 2019-04-04 ENCOUNTER — Telehealth: Payer: Self-pay

## 2019-04-04 NOTE — Telephone Encounter (Signed)
Patient has an appt scheduled with you on 04/24/19 to establish care and CPE at 8am. She is wanting to reschedule to the 1st week in June. Would you be able to work her in so she can come in sooner? Please advise. Contact info is correct.   First name pronounced: "Ra-Dee"  Thanks!

## 2019-04-09 NOTE — Telephone Encounter (Signed)
Patient was advised.  

## 2019-04-09 NOTE — Telephone Encounter (Signed)
First week of June looks slammed.  Honestly, she should probably keep the appt on 6/11, because we will be doing a lot of CPEs and it may be hard to move the appt.

## 2019-04-24 ENCOUNTER — Ambulatory Visit: Payer: Self-pay | Admitting: Family Medicine

## 2019-04-30 LAB — BASIC METABOLIC PANEL
BUN: 9 (ref 4–21)
Creatinine: 0.7 (ref 0.5–1.1)
Glucose: 148
Potassium: 4.4 (ref 3.4–5.3)
Sodium: 137 (ref 137–147)

## 2019-04-30 LAB — TSH: TSH: 1.23 (ref 0.41–5.90)

## 2019-08-14 ENCOUNTER — Encounter: Payer: Self-pay | Admitting: Family Medicine

## 2019-08-14 ENCOUNTER — Other Ambulatory Visit: Payer: Self-pay

## 2019-08-14 ENCOUNTER — Ambulatory Visit: Payer: No Typology Code available for payment source | Admitting: Family Medicine

## 2019-08-14 VITALS — BP 108/65 | HR 71 | Temp 97.1°F | Ht 64.0 in | Wt 199.2 lb

## 2019-08-14 DIAGNOSIS — Z23 Encounter for immunization: Secondary | ICD-10-CM

## 2019-08-14 DIAGNOSIS — E538 Deficiency of other specified B group vitamins: Secondary | ICD-10-CM

## 2019-08-14 DIAGNOSIS — Z0000 Encounter for general adult medical examination without abnormal findings: Secondary | ICD-10-CM

## 2019-08-14 DIAGNOSIS — E669 Obesity, unspecified: Secondary | ICD-10-CM | POA: Diagnosis not present

## 2019-08-14 DIAGNOSIS — E038 Other specified hypothyroidism: Secondary | ICD-10-CM

## 2019-08-14 DIAGNOSIS — E559 Vitamin D deficiency, unspecified: Secondary | ICD-10-CM

## 2019-08-14 DIAGNOSIS — E063 Autoimmune thyroiditis: Secondary | ICD-10-CM

## 2019-08-14 DIAGNOSIS — Z833 Family history of diabetes mellitus: Secondary | ICD-10-CM

## 2019-08-14 DIAGNOSIS — Z862 Personal history of diseases of the blood and blood-forming organs and certain disorders involving the immune mechanism: Secondary | ICD-10-CM

## 2019-08-14 MED ORDER — LEVOTHYROXINE SODIUM 50 MCG PO TABS: 50.0000 ug | ORAL_TABLET | Freq: Every day | ORAL | 1 refills | Status: DC

## 2019-08-14 NOTE — Patient Instructions (Signed)
Preventive Care 21-34 Years Old, Female Preventive care refers to visits with your health care provider and lifestyle choices that can promote health and wellness. This includes:  A yearly physical exam. This may also be called an annual well check.  Regular dental visits and eye exams.  Immunizations.  Screening for certain conditions.  Healthy lifestyle choices, such as eating a healthy diet, getting regular exercise, not using drugs or products that contain nicotine and tobacco, and limiting alcohol use. What can I expect for my preventive care visit? Physical exam Your health care provider will check your:  Height and weight. This may be used to calculate body mass index (BMI), which tells if you are at a healthy weight.  Heart rate and blood pressure.  Skin for abnormal spots. Counseling Your health care provider may ask you questions about your:  Alcohol, tobacco, and drug use.  Emotional well-being.  Home and relationship well-being.  Sexual activity.  Eating habits.  Work and work environment.  Method of birth control.  Menstrual cycle.  Pregnancy history. What immunizations do I need?  Influenza (flu) vaccine  This is recommended every year. Tetanus, diphtheria, and pertussis (Tdap) vaccine  You may need a Td booster every 10 years. Varicella (chickenpox) vaccine  You may need this if you have not been vaccinated. Human papillomavirus (HPV) vaccine  If recommended by your health care provider, you may need three doses over 6 months. Measles, mumps, and rubella (MMR) vaccine  You may need at least one dose of MMR. You may also need a second dose. Meningococcal conjugate (MenACWY) vaccine  One dose is recommended if you are age 19-21 years and a first-year college student living in a residence hall, or if you have one of several medical conditions. You may also need additional booster doses. Pneumococcal conjugate (PCV13) vaccine  You may need  this if you have certain conditions and were not previously vaccinated. Pneumococcal polysaccharide (PPSV23) vaccine  You may need one or two doses if you smoke cigarettes or if you have certain conditions. Hepatitis A vaccine  You may need this if you have certain conditions or if you travel or work in places where you may be exposed to hepatitis A. Hepatitis B vaccine  You may need this if you have certain conditions or if you travel or work in places where you may be exposed to hepatitis B. Haemophilus influenzae type b (Hib) vaccine  You may need this if you have certain conditions. You may receive vaccines as individual doses or as more than one vaccine together in one shot (combination vaccines). Talk with your health care provider about the risks and benefits of combination vaccines. What tests do I need?  Blood tests  Lipid and cholesterol levels. These may be checked every 5 years starting at age 20.  Hepatitis C test.  Hepatitis B test. Screening  Diabetes screening. This is done by checking your blood sugar (glucose) after you have not eaten for a while (fasting).  Sexually transmitted disease (STD) testing.  BRCA-related cancer screening. This may be done if you have a family history of breast, ovarian, tubal, or peritoneal cancers.  Pelvic exam and Pap test. This may be done every 3 years starting at age 21. Starting at age 30, this may be done every 5 years if you have a Pap test in combination with an HPV test. Talk with your health care provider about your test results, treatment options, and if necessary, the need for more tests.   Follow these instructions at home: Eating and drinking   Eat a diet that includes fresh fruits and vegetables, whole grains, lean protein, and low-fat dairy.  Take vitamin and mineral supplements as recommended by your health care provider.  Do not drink alcohol if: ? Your health care provider tells you not to drink. ? You are  pregnant, may be pregnant, or are planning to become pregnant.  If you drink alcohol: ? Limit how much you have to 0-1 drink a day. ? Be aware of how much alcohol is in your drink. In the [place] one drink equals one 12 oz bottle of beer (355 mL), one 5 oz glass of wine (148 mL), or one 1 oz glass of hard liquor (44 mL). Lifestyle  Take daily care of your teeth and gums.  Stay active. Exercise for at least 30 minutes on 5 or more days each week.  Do not use any products that contain nicotine or tobacco, such as cigarettes, e-cigarettes, and chewing tobacco. If you need help quitting, ask your health care provider.  If you are sexually active, practice safe sex. Use a condom or other form of birth control (contraception) in order to prevent pregnancy and STIs (sexually transmitted infections). If you plan to become pregnant, see your health care provider for a preconception visit. What's next?  Visit your health care provider once a year for a well check visit.  Ask your health care provider how often you should have your eyes and teeth checked.  Stay up to date on all vaccines. This information is not intended to replace advice given to you by your health care provider. Make sure you discuss any questions you have with your health care provider. Document Released: 12/26/2001 Document Revised: 07/11/2018 Document Reviewed: 07/11/2018 Elsevier Patient Education  2020 [employer]  

## 2019-08-14 NOTE — Progress Notes (Signed)
Patient: Shelly OvensRiddhi Coleman, Female    DOB: [birth year], 33 y.o.   MRN: [id number] Visit Date: 08/14/2019  Today's Provider: Shirlee LatchAngela Rafel Garde, MD   Chief Complaint  Patient presents with  . Establish Care   Subjective:    I, Shelly RaddleNikki Coleman, CMA, am acting as a scribe for Shirlee LatchAngela Brance Dartt, MD.    New Patient:  Shelly OvensRiddhi Takeshita is a 33 y.o. female who presents today to Establish Care.  She feels well. She reports exercising 5 days per week. She reports she is sleeping fairly well.  ----------------------------------------------------------------- Obesity:  Sees Dr Tedd SiasSolum for Endocrinology for hypothyroidism Started taking phentermine - did not work out Stopped taking it 20-30 days ago Has lost 4-5 lbs in last month Trying to walk more [employer] HotelsStarted MBA program recently, Also working full time [employer] CorporationFeels like she does not have time to sleep more than 5 hours per night Feels like she is still losing pregnancy weight - gained 45-50 lbs in pregnancy (mostly in last trimester) - son in 2717 months old, delivered at 4741 wks via emergency C section due to fetal bradycardia Tried intermittent fasting - did not work out   H/o migraines, none recently   Review of Systems  Constitutional: Negative.   HENT: Negative.   Eyes: Negative.   Respiratory: Negative.   Cardiovascular: Negative.   Gastrointestinal: Negative.   Endocrine: Negative.   Genitourinary: Negative.   Musculoskeletal: Negative.   Skin: Negative.   Allergic/Immunologic: Negative.   Neurological: Negative.   Hematological: Negative.   Psychiatric/Behavioral: Negative.     Social History      She  reports that she has never smoked. She has never used smokeless tobacco. She reports current alcohol use. She reports that she does not use drugs.       Social History   Socioeconomic History  . Marital status: Married    Spouse name: Shelly Coleman  . Number of children: 1  . Years of education: 7718  . Highest education level: Not on file   Occupational History  . Occupation: unemployed  Social Needs  . Financial resource strain: Not on file  . Food insecurity    Worry: Not on file    Inability: Not on file  . Transportation needs    Medical: Not on file    Non-medical: Not on file  Tobacco Use  . Smoking status: Never Smoker  . Smokeless tobacco: Never Used  Substance and Sexual Activity  . Alcohol use: Yes    Comment: occasionally  . Drug use: No  . Sexual activity: Yes    Birth control/protection: None, Condom  Lifestyle  . Physical activity    Days per week: Not on file    Minutes per session: Not on file  . Stress: Not on file  Relationships  . Social Musicianconnections    Talks on phone: Not on file    Gets together: Not on file    Attends religious service: Not on file    Active member of club or organization: Not on file    Attends meetings of clubs or organizations: Not on file    Relationship status: Not on file  Other Topics Concern  . Not on file  Social History Narrative  . Not on file    Past Medical History:  Diagnosis Date  . Migraine headache with aura   . Obesity (BMI 35.0-39.9 without comorbidity)   . Thyroid disease    hypothyroidism  . Vegetarian diet  Patient Active Problem List   Diagnosis Date Noted  . Hypothyroidism due to Hashimoto's thyroiditis 01/31/2019  . Iron deficiency anemia 05/26/2018  . Migraine headache with aura   . S/P cesarean section 03/20/2018  . Abnormal screening blood test for Down syndrome in first trimester 10/23/2017  . Vegetarian diet 10/23/2017  . Vitamin D deficiency 08/14/2017  . Chronic anemia 08/14/2017  . Acquired hypothyroidism 08/10/2017  . Migraine with aura and without status migrainosus, not intractable 08/10/2017  . Obesity (BMI 30.0-34.9) 08/10/2017  . Goiter diffuse 11/02/2015  . Dyspareunia, female 11/02/2015    Past Surgical History:  Procedure Laterality Date  . CESAREAN SECTION N/A 03/20/2018   Procedure: CESAREAN SECTION-  STAT;  Surgeon: Shelly Milch, MD;  Location: [facility] ORS;  Service: Obstetrics;  Laterality: N/A;    Family History        Family Status  Relation Name Status  . MGF  Deceased at age 83  . Father  Alive  . PGM  Alive  . PGF  Deceased  . Mother  Alive  . Brother  Alive  . Son  Alive  . MGM  Deceased        Her family history includes Diabetes (age of onset: 71) in her paternal grandfather; Diabetes (age of onset: 28) in her father; Diabetes (age of onset: 54) in her paternal grandmother; Healthy in her brother and son; Hypertension (age of onset: 42) in her paternal grandfather; Hypertension (age of onset: 13) in her father; Leukemia in her maternal grandfather; Thyroid disease in her mother and paternal grandmother.      No Known Allergies   Current Outpatient Medications:  .  acetaminophen (TYLENOL) 500 MG tablet, Take 500 mg by mouth every 6 (six) hours as needed., Disp: , Rfl:  .  Cholecalciferol (VITAMIN D3) 5000 units CAPS, Take 1 capsule by mouth daily., Disp: , Rfl:  .  levothyroxine (SYNTHROID, LEVOTHROID) 50 MCG tablet, Take 1 tablet by mouth daily., Disp: , Rfl:  .  Multiple Vitamin (MULTIVITAMIN WITH MINERALS) TABS tablet, Take 1 tablet by mouth daily., Disp: , Rfl:  .  phentermine (ADIPEX-P) 37.5 MG tablet, Take by mouth., Disp: , Rfl:    Patient Care Team: Shelly Downer, MD as PCP - General (Family Medicine)    Objective:    Vitals: BP 108/65 (BP Location: Right Arm, Patient Position: Sitting, Cuff Size: Normal)   Pulse 71   Temp (!) 97.1 F (36.2 C) (Temporal)   Ht 5\' 4"  (1.626 m)   Wt 199 lb 3.2 oz (90.4 kg)   BMI 34.19 kg/m    Vitals:   08/14/19 1522  BP: 108/65  Pulse: 71  Temp: (!) 97.1 F (36.2 C)  TempSrc: Temporal  Weight: 199 lb 3.2 oz (90.4 kg)  Height: 5\' 4"  (1.626 m)     Physical Exam Vitals signs reviewed.  Constitutional:      General: She is not in acute distress.    Appearance: Normal appearance. She is  well-developed. She is not diaphoretic.  HENT:     Head: Normocephalic and atraumatic.     Right Ear: Tympanic membrane, ear canal and external ear normal.     Left Ear: Tympanic membrane, ear canal and external ear normal.  Eyes:     General: No scleral icterus.    Conjunctiva/sclera: Conjunctivae normal.     Pupils: Pupils are equal, round, and reactive to light.  Neck:     Musculoskeletal: Neck supple.  Thyroid: No thyromegaly.  Cardiovascular:     Rate and Rhythm: Normal rate and regular rhythm.     Pulses: Normal pulses.     Heart sounds: Normal heart sounds. No murmur.  Pulmonary:     Effort: Pulmonary effort is normal. No respiratory distress.     Breath sounds: Normal breath sounds. No wheezing or rales.  Abdominal:     General: There is no distension.     Palpations: Abdomen is soft.     Tenderness: There is no abdominal tenderness.  Musculoskeletal:        General: No deformity.     Right lower leg: No edema.     Left lower leg: No edema.  Lymphadenopathy:     Cervical: No cervical adenopathy.  Skin:    General: Skin is warm and dry.     Capillary Refill: Capillary refill takes less than 2 seconds.     Findings: No rash.  Neurological:     Mental Status: She is alert and oriented to person, place, and time. Mental status is at baseline.  Psychiatric:        Mood and Affect: Mood normal.        Behavior: Behavior normal.        Thought Content: Thought content normal.      Depression Screen No flowsheet data found.     Assessment & Plan:     Routine Health Maintenance and Physical Exam  Exercise Activities and Dietary recommendations Goals   None     Immunization History  Administered Date(s) Administered  . Influenza Inj Mdck Quad Pf 08/12/2018  . Influenza,inj,Quad PF,6+ Mos 10/09/2017  . Tdap 01/17/2018    Health Maintenance  Topic Date Due  . PAP SMEAR-Modifier  07/16/2017  . INFLUENZA VACCINE  06/14/2019  . TETANUS/TDAP   01/18/2028  . HIV Screening  Completed     Discussed health benefits of physical activity, and encouraged her to engage in regular exercise appropriate for her age and condition.    Advised need to get next Pap smear-sees GYN --------------------------------------------------------------------  Problem List Items Addressed This Visit      Endocrine   Hypothyroidism due to Hashimoto's thyroiditis    Followed by endocrinology -but contemplating switch to PCP to manage this in the future Continue Synthroid at current dose Recheck TSH      Relevant Medications   levothyroxine (SYNTHROID) 50 MCG tablet   Other Relevant Orders   TSH     Other   Obesity (BMI 30.0-34.9)    Discussed importance of healthy weight management Discussed diet and exercise       Relevant Orders   Lipid panel   Hepatic function panel   Hemoglobin A1c   Vitamin D deficiency    Recheck vitamin D level      Relevant Orders   VITAMIN D 25 Hydroxy (Vit-D Deficiency, Fractures)   Vitamin B12 deficiency    Recheck B12 level      Relevant Orders   B12   History of anemia    Previous history of anemia in the setting of emergency C-section Recheck CBC      Relevant Orders   CBC w/Diff/Platelet   Family history of diabetes mellitus    Check hemoglobin A1c Discussed low-carb diet      Relevant Orders   Hemoglobin A1c    Other Visit Diagnoses    Encounter for annual physical exam    -  Primary   Relevant Orders   Lipid  panel   Hepatic function panel   Hemoglobin A1c   CBC w/Diff/Platelet   VITAMIN D 25 Hydroxy (Vit-D Deficiency, Fractures)   B12       Return in about 1 year (around 08/13/2020) for CPE.   The entirety of the information documented in the History of Present Illness, Review of Systems and Physical Exam were personally obtained by me. Portions of this information were initially documented by Shelly Coleman, CMA and reviewed by me for thoroughness and accuracy.     Alfonso Carden, Dionne Bucy, MD MPH [facility]

## 2019-08-15 DIAGNOSIS — E538 Deficiency of other specified B group vitamins: Secondary | ICD-10-CM | POA: Insufficient documentation

## 2019-08-15 DIAGNOSIS — Z833 Family history of diabetes mellitus: Secondary | ICD-10-CM | POA: Insufficient documentation

## 2019-08-15 DIAGNOSIS — Z862 Personal history of diseases of the blood and blood-forming organs and certain disorders involving the immune mechanism: Secondary | ICD-10-CM | POA: Insufficient documentation

## 2019-08-15 NOTE — Assessment & Plan Note (Signed)
Recheck vitamin D level 

## 2019-08-15 NOTE — Assessment & Plan Note (Signed)
Check hemoglobin A1c Discussed low-carb diet

## 2019-08-15 NOTE — Assessment & Plan Note (Signed)
Discussed importance of healthy weight management Discussed diet and exercise  

## 2019-08-15 NOTE — Assessment & Plan Note (Signed)
Recheck B12 level 

## 2019-08-15 NOTE — Assessment & Plan Note (Signed)
Followed by endocrinology -but contemplating switch to PCP to manage this in the future Continue Synthroid at current dose Recheck TSH

## 2019-08-15 NOTE — Assessment & Plan Note (Signed)
Previous history of anemia in the setting of emergency C-section Recheck CBC

## 2019-08-29 ENCOUNTER — Other Ambulatory Visit: Payer: Self-pay | Admitting: Family Medicine

## 2019-08-30 LAB — VITAMIN D 25 HYDROXY (VIT D DEFICIENCY, FRACTURES): Vit D, 25-Hydroxy: 20.6 ng/mL — ABNORMAL LOW (ref 30.0–100.0)

## 2019-08-30 LAB — CBC WITH DIFFERENTIAL/PLATELET
Basophils Absolute: 0 10*3/uL (ref 0.0–0.2)
Basos: 0 %
EOS (ABSOLUTE): 0.1 10*3/uL (ref 0.0–0.4)
Eos: 2 %
Hematocrit: 37.4 % (ref 34.0–46.6)
Hemoglobin: 12.2 g/dL (ref 11.1–15.9)
Immature Grans (Abs): 0 10*3/uL (ref 0.0–0.1)
Immature Granulocytes: 0 %
Lymphocytes Absolute: 2.6 10*3/uL (ref 0.7–3.1)
Lymphs: 32 %
MCH: 26.5 pg — ABNORMAL LOW (ref 26.6–33.0)
MCHC: 32.6 g/dL (ref 31.5–35.7)
MCV: 81 fL (ref 79–97)
Monocytes Absolute: 0.4 10*3/uL (ref 0.1–0.9)
Monocytes: 5 %
Neutrophils Absolute: 5 10*3/uL (ref 1.4–7.0)
Neutrophils: 61 %
Platelets: 322 10*3/uL (ref 150–450)
RBC: 4.6 x10E6/uL (ref 3.77–5.28)
RDW: 13.4 % (ref 11.7–15.4)
WBC: 8.1 10*3/uL (ref 3.4–10.8)

## 2019-08-30 LAB — HEPATIC FUNCTION PANEL
ALT: 17 IU/L (ref 0–32)
AST: 13 IU/L (ref 0–40)
Albumin: 4.3 g/dL (ref 3.8–4.8)
Alkaline Phosphatase: 68 IU/L (ref 39–117)
Bilirubin Total: 0.3 mg/dL (ref 0.0–1.2)
Bilirubin, Direct: 0.06 mg/dL (ref 0.00–0.40)
Total Protein: 7.3 g/dL (ref 6.0–8.5)

## 2019-08-30 LAB — LIPID PANEL
Chol/HDL Ratio: 4.5 ratio — ABNORMAL HIGH (ref 0.0–4.4)
Cholesterol, Total: 224 mg/dL — ABNORMAL HIGH (ref 100–199)
HDL: 50 mg/dL (ref >39)
LDL Chol Calc (NIH): 150 mg/dL — ABNORMAL HIGH (ref 0–99)
Triglycerides: 136 mg/dL (ref 0–149)
VLDL Cholesterol Cal: 24 mg/dL (ref 5–40)

## 2019-08-30 LAB — HEMOGLOBIN A1C
Est. average glucose Bld gHb Est-mCnc: 120 mg/dL
Hgb A1c MFr Bld: 5.8 % — ABNORMAL HIGH (ref 4.8–5.6)

## 2019-08-30 LAB — VITAMIN B12: Vitamin B-12: 319 pg/mL (ref 232–1245)

## 2019-08-30 LAB — TSH: TSH: 1.52 u[IU]/mL (ref 0.450–4.500)

## 2019-09-01 ENCOUNTER — Telehealth: Payer: Self-pay

## 2019-09-01 NOTE — Telephone Encounter (Signed)
Pt advised.   Thanks,   -Laura  

## 2019-09-01 NOTE — Telephone Encounter (Signed)
-----   Message from Virginia Crews, MD sent at 09/01/2019  8:50 AM EDT ----- Normal B12, Blood counts, liver function tests, thyroid function.  Cholesterol is elevated, but no medication needed at this time.  I instead, recommend diet low in saturated fat and regular exercise - 30 min at least 5 times per week.  Hemoglobin A1c, a 50-month average of blood sugars, is elevated in the prediabetes range.  To prevent progression to diabetes, I recommend low-carb diet and regular exercise.  We will monitor this at least annually.  Vitamin D level is slightly low, as well.  Recommend supplement with OTC vitamin D3 1000 to 2000 units daily.

## 2020-02-13 ENCOUNTER — Ambulatory Visit: Payer: No Typology Code available for payment source | Admitting: Family Medicine

## 2020-03-01 ENCOUNTER — Ambulatory Visit: Payer: No Typology Code available for payment source | Admitting: Family Medicine

## 2020-08-11 ENCOUNTER — Other Ambulatory Visit: Payer: Self-pay | Admitting: Family Medicine

## 2020-08-11 NOTE — Telephone Encounter (Signed)
Requested Prescriptions  Pending Prescriptions Disp Refills   levothyroxine (SYNTHROID) 50 MCG tablet [Pharmacy Med Name: Levothyroxine Sodium 50 MCG Oral Tablet] 30 tablet     Sig: TAKE 1 TABLET BY MOUTH  DAILY     Endocrinology:  Hypothyroid Agents Failed - 08/11/2020 11:11 AM      Failed - TSH needs to be rechecked within 3 months after an abnormal result. Refill until TSH is due.      Failed - Valid encounter within last 12 months    Recent Outpatient Visits          12 months ago Encounter for annual physical exam   [facility] Bacigalupo, Marzella Schlein, MD      Future Appointments            In 3 weeks Bacigalupo, Marzella Schlein, MD [facility]           Passed - TSH in normal range and within 360 days    TSH  Date Value Ref Range Status  08/29/2019 1.520 0.450 - 4.500 uIU/mL Final

## 2020-08-20 ENCOUNTER — Encounter: Payer: No Typology Code available for payment source | Admitting: Family Medicine

## 2020-09-06 ENCOUNTER — Encounter: Payer: Self-pay | Admitting: Family Medicine

## 2020-09-06 ENCOUNTER — Other Ambulatory Visit: Payer: Self-pay

## 2020-09-06 ENCOUNTER — Ambulatory Visit (INDEPENDENT_AMBULATORY_CARE_PROVIDER_SITE_OTHER): Payer: No Typology Code available for payment source | Admitting: Family Medicine

## 2020-09-06 VITALS — BP 126/78 | HR 82 | Temp 98.4°F | Resp 16 | Wt 208.0 lb

## 2020-09-06 DIAGNOSIS — Z862 Personal history of diseases of the blood and blood-forming organs and certain disorders involving the immune mechanism: Secondary | ICD-10-CM

## 2020-09-06 DIAGNOSIS — F32A Depression, unspecified: Secondary | ICD-10-CM

## 2020-09-06 DIAGNOSIS — E538 Deficiency of other specified B group vitamins: Secondary | ICD-10-CM | POA: Diagnosis not present

## 2020-09-06 DIAGNOSIS — E063 Autoimmune thyroiditis: Secondary | ICD-10-CM

## 2020-09-06 DIAGNOSIS — E038 Other specified hypothyroidism: Secondary | ICD-10-CM

## 2020-09-06 DIAGNOSIS — E559 Vitamin D deficiency, unspecified: Secondary | ICD-10-CM | POA: Diagnosis not present

## 2020-09-06 DIAGNOSIS — F419 Anxiety disorder, unspecified: Secondary | ICD-10-CM

## 2020-09-06 DIAGNOSIS — Z1159 Encounter for screening for other viral diseases: Secondary | ICD-10-CM | POA: Diagnosis not present

## 2020-09-06 DIAGNOSIS — Z6835 Body mass index (BMI) 35.0-35.9, adult: Secondary | ICD-10-CM

## 2020-09-06 DIAGNOSIS — F411 Generalized anxiety disorder: Secondary | ICD-10-CM | POA: Insufficient documentation

## 2020-09-06 DIAGNOSIS — E669 Obesity, unspecified: Secondary | ICD-10-CM

## 2020-09-06 DIAGNOSIS — R7303 Prediabetes: Secondary | ICD-10-CM | POA: Insufficient documentation

## 2020-09-06 DIAGNOSIS — Z0000 Encounter for general adult medical examination without abnormal findings: Secondary | ICD-10-CM | POA: Diagnosis not present

## 2020-09-06 DIAGNOSIS — F321 Major depressive disorder, single episode, moderate: Secondary | ICD-10-CM | POA: Insufficient documentation

## 2020-09-06 MED ORDER — RYBELSUS 3 MG PO TABS: 3.0000 mg | ORAL_TABLET | Freq: Every day | ORAL | 5 refills | Status: DC

## 2020-09-06 NOTE — Patient Instructions (Signed)
Preventive Care 21-35 Years Old, Female Preventive care refers to visits with your health care provider and lifestyle choices that can promote health and wellness. This includes:  A yearly physical exam. This may also be called an annual well check.  Regular dental visits and eye exams.  Immunizations.  Screening for certain conditions.  Healthy lifestyle choices, such as eating a healthy diet, getting regular exercise, not using drugs or products that contain nicotine and tobacco, and limiting alcohol use. What can I expect for my preventive care visit? Physical exam Your health care provider will check your:  Height and weight. This may be used to calculate body mass index (BMI), which tells if you are at a healthy weight.  Heart rate and blood pressure.  Skin for abnormal spots. Counseling Your health care provider may ask you questions about your:  Alcohol, tobacco, and drug use.  Emotional well-being.  Home and relationship well-being.  Sexual activity.  Eating habits.  Work and work environment.  Method of birth control.  Menstrual cycle.  Pregnancy history. What immunizations do I need?  Influenza (flu) vaccine  This is recommended every year. Tetanus, diphtheria, and pertussis (Tdap) vaccine  You may need a Td booster every 10 years. Varicella (chickenpox) vaccine  You may need this if you have not been vaccinated. Human papillomavirus (HPV) vaccine  If recommended by your health care provider, you may need three doses over 6 months. Measles, mumps, and rubella (MMR) vaccine  You may need at least one dose of MMR. You may also need a second dose. Meningococcal conjugate (MenACWY) vaccine  One dose is recommended if you are age 19-21 years and a first-year college student living in a residence hall, or if you have one of several medical conditions. You may also need additional booster doses. Pneumococcal conjugate (PCV13) vaccine  You may need  this if you have certain conditions and were not previously vaccinated. Pneumococcal polysaccharide (PPSV23) vaccine  You may need one or two doses if you smoke cigarettes or if you have certain conditions. Hepatitis A vaccine  You may need this if you have certain conditions or if you travel or work in places where you may be exposed to hepatitis A. Hepatitis B vaccine  You may need this if you have certain conditions or if you travel or work in places where you may be exposed to hepatitis B. Haemophilus influenzae type b (Hib) vaccine  You may need this if you have certain conditions. You may receive vaccines as individual doses or as more than one vaccine together in one shot (combination vaccines). Talk with your health care provider about the risks and benefits of combination vaccines. What tests do I need?  Blood tests  Lipid and cholesterol levels. These may be checked every 5 years starting at age 20.  Hepatitis C test.  Hepatitis B test. Screening  Diabetes screening. This is done by checking your blood sugar (glucose) after you have not eaten for a while (fasting).  Sexually transmitted disease (STD) testing.  BRCA-related cancer screening. This may be done if you have a family history of breast, ovarian, tubal, or peritoneal cancers.  Pelvic exam and Pap test. This may be done every 3 years starting at age 21. Starting at age 30, this may be done every 5 years if you have a Pap test in combination with an HPV test. Talk with your health care provider about your test results, treatment options, and if necessary, the need for more tests.   Follow these instructions at home: Eating and drinking   Eat a diet that includes fresh fruits and vegetables, whole grains, lean protein, and low-fat dairy.  Take vitamin and mineral supplements as recommended by your health care provider.  Do not drink alcohol if: ? Your health care provider tells you not to drink. ? You are  pregnant, may be pregnant, or are planning to become pregnant.  If you drink alcohol: ? Limit how much you have to 0-1 drink a day. ? Be aware of how much alcohol is in your drink. In the [place] one drink equals one 12 oz bottle of beer (355 mL), one 5 oz glass of wine (148 mL), or one 1 oz glass of hard liquor (44 mL). Lifestyle  Take daily care of your teeth and gums.  Stay active. Exercise for at least 30 minutes on 5 or more days each week.  Do not use any products that contain nicotine or tobacco, such as cigarettes, e-cigarettes, and chewing tobacco. If you need help quitting, ask your health care provider.  If you are sexually active, practice safe sex. Use a condom or other form of birth control (contraception) in order to prevent pregnancy and STIs (sexually transmitted infections). If you plan to become pregnant, see your health care provider for a preconception visit. What's next?  Visit your health care provider once a year for a well check visit.  Ask your health care provider how often you should have your eyes and teeth checked.  Stay up to date on all vaccines. This information is not intended to replace advice given to you by your health care provider. Make sure you discuss any questions you have with your health care provider. Document Revised: 07/11/2018 Document Reviewed: 07/11/2018 Elsevier Patient Education  2020 [employer].

## 2020-09-06 NOTE — Assessment & Plan Note (Addendum)
Discussed options for weight loss Add rybelsus to aid with weight loss and blood sugar control Offered referral to medical weight management clinic, but patient declines She will work with a plan that has helped her lose weight in the past

## 2020-09-06 NOTE — Assessment & Plan Note (Addendum)
Patient presents with feelings of stress anxiety, weight gain, thoughts of inadequacy, anhedonia, decreased libido and poor sleep for approximately 3 months and they have continued to worsen, these symptoms are consistent with a diagnosis of anxiety and/or MDD.  Patient denies any active SI or HI or A/V/H. Due to the impairment of her life function, intervention is indicated at this time.   - will explore CBT options - consider starting SSRI if symptoms persists (10mg  Zoloft) - F/u 3 Months

## 2020-09-06 NOTE — Progress Notes (Signed)
Complete physical exam   Patient: Roderic OvensRiddhi Sabia   DOB: 1986/05/05   34 y.o. Female  MRN: [id number] Visit Date: 09/06/2020  Today's healthcare provider: Shirlee LatchAngela Quetzalli Clos, MD   Chief Complaint  Patient presents with  . Annual Exam  . Anxiety   Subjective    Shali Michae Kavagarwal is a 34 y.o. female who presents today for a complete physical exam. She has had a several month history of feelings of anxiousness, delayed periods, sleeplessness, fatigue, weight gain, anhedonia, loss of sex drive and unpleasant thoughts. She has been dealing with discrimination from her boss at work, and there is currently and investigation underway with no conclusion. 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 She usually finds enjoyment in playing with her son or going to get massages but has found little joy in these things recently. She has had no desire to work out and finds her self eating more in "bursts". Her sleep is described as restless and minimal because her time is occupied by her son and her assignments. She also feels as though she has no sense of libido, but is ambivalent about it, she is concerned it will never come back but is alright without sex because she "does not want another baby right now and I don't want to be on OCPs". Her menstrual Cycles have extended from q30 days to q38-45 and have been lighter. ROS is positive for spells of SOB and grossly negative for all other symptoms.    Past Medical History:  Diagnosis Date  . Migraine headache with aura   . Obesity (BMI 35.0-39.9 without comorbidity)   . Thyroid disease    hypothyroidism  . Vegetarian diet    Past Surgical History:  Procedure Laterality Date  . CESAREAN SECTION N/A 03/20/2018   Procedure: CESAREAN SECTION- STAT;  Surgeon: Natale MilchSchuman, Christanna R, MD;  Location: [facility] ORS;  Service: Obstetrics;  Laterality: N/A;   Social History   Socioeconomic History  .  Marital status: Married    Spouse name: Binay  . Number of children: 1  . Years of education: 5018  . Highest education level: Not on file  Occupational History  . Occupation: mortgage     Comment: [employer]  Tobacco Use  . Smoking status: Never Smoker  . Smokeless tobacco: Never Used  Vaping Use  . Vaping Use: Never used  Substance and Sexual Activity  . Alcohol use: Yes    Comment: rare  . Drug use: No  . Sexual activity: Yes    Partners: Male    Birth control/protection: Condom  Other Topics Concern  . Not on file  Social History Narrative  . Not on file   Social Determinants of Health   Financial Resource Strain:   . Difficulty of Paying Living Expenses: Not on file  Food Insecurity:   . Worried About Programme researcher, broadcasting/film/videounning Out of Food in the Last Year: Not on file  . Ran Out of Food in the Last Year: Not on file  Transportation Needs:   . Lack of Transportation (Medical): Not on file  . Lack of Transportation (Non-Medical): Not on file  Physical Activity:   . Days of Exercise per Week: Not on file  . Minutes of Exercise per Session: Not on file  Stress:   . Feeling of Stress : Not on file  Social Connections:   . Frequency of Communication with Friends and Family:  Not on file  . Frequency of Social Gatherings with Friends and Family: Not on file  . Attends Religious Services: Not on file  . Active Member of Clubs or Organizations: Not on file  . Attends Banker Meetings: Not on file  . Marital Status: Not on file  Intimate Partner Violence:   . Fear of Current or Ex-Partner: Not on file  . Emotionally Abused: Not on file  . Physically Abused: Not on file  . Sexually Abused: Not on file   Family Status  Relation Name Status  . MGF  Deceased at age 42  . Father  Alive  . PGM  Alive  . PGF  Deceased  . Mother  Alive  . Brother  Alive  . Son  Alive  . MGM  Deceased  . Neg Hx  (Not Specified)   Family History  Problem Relation Age of Onset  . Leukemia  Maternal Grandfather   . Hypertension Father 41  . Diabetes Father 21  . Diabetes Paternal Grandmother 25  . Thyroid disease Paternal Grandmother   . Hypertension Paternal Grandfather 39  . Diabetes Paternal Grandfather 39  . Thyroid disease Mother   . Healthy Brother   . Healthy Son   . Breast cancer Neg Hx   . Colon cancer Neg Hx    No Known Allergies  Patient Care Team: Erasmo Downer, MD as PCP - General (Family Medicine)   Medications: Outpatient Medications Prior to Visit  Medication Sig  . Cholecalciferol (VITAMIN D3) 5000 units CAPS Take 1 capsule by mouth daily.  Marland Kitchen levothyroxine (SYNTHROID) 50 MCG tablet TAKE 1 TABLET BY MOUTH  DAILY  . Multiple Vitamin (MULTIVITAMIN WITH MINERALS) TABS tablet Take 1 tablet by mouth daily.   No facility-administered medications prior to visit.    Review of Systems  Constitutional: Positive for activity change, appetite change and fatigue.  HENT: Negative.   Eyes: Negative.   Respiratory: Positive for shortness of breath.   Cardiovascular: Negative.   Gastrointestinal: Negative.   Endocrine: Negative.   Genitourinary: Negative.   Musculoskeletal: Negative.   Skin: Negative.   Allergic/Immunologic: Negative.   Neurological: Negative.   Hematological: Negative.   Psychiatric/Behavioral: Positive for decreased concentration, dysphoric mood and sleep disturbance. The patient is nervous/anxious.       Objective    BP 126/78   Pulse 82   Temp 98.4 F (36.9 C)   Resp 16   Wt 208 lb (94.3 kg)   BMI 35.70 kg/m    Physical Exam  General: Slightly anxious appearing woman sitting comfortably in the chair, pleasant conversation, good insight, dysthymic affect Lungs: CTA Bilaterally, Normal Work of breathing Cards: RRR, No murmurs rubs or gallops. GI: Non-distended non-tender Derm: No apparent rashes Psych: A & O x3, No SI or A/V/H  Last depression screening scores PHQ 2/9 Scores 09/06/2020 08/14/2019  PHQ - 2 Score 5  3  PHQ- 9 Score 12 8   Last fall risk screening Fall Risk  08/14/2019  Falls in the past year? 0  Number falls in past yr: 0  Injury with Fall? 0   Last Audit-C alcohol use screening Alcohol Use Disorder Test (AUDIT) 09/06/2020  1. How often do you have a drink containing alcohol? 1  2. How many drinks containing alcohol do you have on a typical day when you are drinking? 0  3. How often do you have six or more drinks on one occasion? 0  AUDIT-C Score 1  Alcohol Brief Interventions/Follow-up AUDIT Score <7 follow-up not indicated   A score of 3 or more in women, and 4 or more in men indicates increased risk for alcohol abuse, EXCEPT if all of the points are from question 1   No results found for any visits on 09/06/20.  Assessment & Plan    Routine Health Maintenance and Physical Exam  Exercise Activities and Dietary recommendations Goals   None     Immunization History  Administered Date(s) Administered  . Influenza Inj Mdck Quad Pf 08/12/2018  . Influenza,inj,Quad PF,6+ Mos 10/09/2017, 08/14/2019  . Tdap 01/17/2018    Health Maintenance  Topic Date Due  . Hepatitis C Screening  Never done  . COVID-19 Vaccine (1) Never done  . PAP SMEAR-Modifier  11/01/2020  . INFLUENZA VACCINE  02/10/2021 (Originally 06/13/2020)  . TETANUS/TDAP  01/18/2028  . HIV Screening  Completed    Discussed health benefits of physical activity, and encouraged her to engage in regular exercise appropriate for her age and condition.  Problem List Items Addressed This Visit      Endocrine   Hypothyroidism due to Hashimoto's thyroiditis    Previously well controlled Does currently have some symptoms that could be attributed to hypothyroidism Continue Synthroid at current dose Recheck TSH      Relevant Orders   TSH     Other   Obesity (BMI 30.0-34.9)    Discussed options for weight loss Add rybelsus to aid with weight loss and blood sugar control Offered referral to medical weight  management clinic, but patient declines She will work with a plan that has helped her lose weight in the past      Vitamin D deficiency    Recheck level      Relevant Orders   VITAMIN D 25 Hydroxy (Vit-D Deficiency, Fractures)   Vitamin B12 deficiency    Recheck level      Relevant Orders   B12   History of anemia    Recheck CBC      Relevant Orders   CBC w/Diff/Platelet   Prediabetes    Encourage low-carb diet Recheck A1c      Relevant Orders   Hemoglobin A1c   Current moderate episode of major depressive disorder without prior episode (HCC)    Symptoms consistent with MDD Discussed SSRI, but patient declines at this time Encourage therapy Contracted for safety-no SI/HI At follow-up, repeat PHQ-9 and GAD-7      Relevant Orders   Ambulatory referral to Psychology   GAD (generalized anxiety disorder)    History and symptoms consistent with generalized anxiety disorder Discussed SSRI, but patient declines at this time Encourage therapy At follow-up, repeat PHQ-9 and GAD-7      Relevant Orders   Ambulatory referral to Psychology   Anxiety and depression    Patient presents with feelings of stress anxiety, weight gain, thoughts of inadequacy, anhedonia, decreased libido and poor sleep for approximately 3 months and they have continued to worsen, these symptoms are consistent with a diagnosis of anxiety and/or MDD.  Patient denies any active SI or HI or A/V/H. Due to the impairment of her life function, intervention is indicated at this time.   - will explore CBT options - consider starting SSRI if symptoms persists (10mg  Zoloft) - F/u 3 Months       Other Visit Diagnoses    Encounter for annual physical exam    -  Primary   Relevant Orders   TSH   Comprehensive metabolic  panel   Lipid panel   VITAMIN D 25 Hydroxy (Vit-D Deficiency, Fractures)   CBC w/Diff/Platelet   Hemoglobin A1c   B12   Hepatitis C Antibody   Need for hepatitis C screening test        Relevant Orders   Hepatitis C Antibody      Return in about 3 months (around 12/07/2020) for MDD/GAD f/u.     Note was initiated by Cathrine Muster, MS3  Patient seen along with MS3 student [facility]. I personally evaluated this patient along with the student, and verified all aspects of the history, physical exam, and medical decision making as documented by the student. I agree with the student's documentation and have made all necessary edits.  Malayasia Mirkin, Marzella Schlein, MD, MPH [facility]

## 2020-09-07 NOTE — Assessment & Plan Note (Signed)
Symptoms consistent with MDD Discussed SSRI, but patient declines at this time Encourage therapy Contracted for safety-no SI/HI At follow-up, repeat PHQ-9 and GAD-7

## 2020-09-07 NOTE — Assessment & Plan Note (Signed)
Encourage low carb diet Recheck A1c 

## 2020-09-07 NOTE — Assessment & Plan Note (Signed)
Recheck level 

## 2020-09-07 NOTE — Assessment & Plan Note (Signed)
Previously well controlled Does currently have some symptoms that could be attributed to hypothyroidism Continue Synthroid at current dose Recheck TSH

## 2020-09-07 NOTE — Assessment & Plan Note (Signed)
Recheck CBC. 

## 2020-09-07 NOTE — Assessment & Plan Note (Signed)
History and symptoms consistent with generalized anxiety disorder Discussed SSRI, but patient declines at this time Encourage therapy At follow-up, repeat PHQ-9 and GAD-7

## 2020-09-09 LAB — COMPREHENSIVE METABOLIC PANEL
ALT: 26 IU/L (ref 0–32)
AST: 16 IU/L (ref 0–40)
Albumin/Globulin Ratio: 1.5 (ref 1.2–2.2)
Albumin: 4.3 g/dL (ref 3.8–4.8)
Alkaline Phosphatase: 71 IU/L (ref 44–121)
BUN/Creatinine Ratio: 21 (ref 9–23)
BUN: 15 mg/dL (ref 6–20)
Bilirubin Total: <0.2 mg/dL (ref 0.0–1.2)
CO2: 18 mmol/L — ABNORMAL LOW (ref 20–29)
Calcium: 9.1 mg/dL (ref 8.7–10.2)
Chloride: 103 mmol/L (ref 96–106)
Creatinine, Ser: 0.71 mg/dL (ref 0.57–1.00)
GFR calc Af Amer: 128 mL/min/{1.73_m2} (ref >59)
GFR calc non Af Amer: 111 mL/min/{1.73_m2} (ref >59)
Globulin, Total: 2.9 g/dL (ref 1.5–4.5)
Glucose: 112 mg/dL — ABNORMAL HIGH (ref 65–99)
Potassium: 4.6 mmol/L (ref 3.5–5.2)
Sodium: 136 mmol/L (ref 134–144)
Total Protein: 7.2 g/dL (ref 6.0–8.5)

## 2020-09-09 LAB — CBC WITH DIFFERENTIAL/PLATELET
Basophils Absolute: 0 10*3/uL (ref 0.0–0.2)
Basos: 0 %
EOS (ABSOLUTE): 0.1 10*3/uL (ref 0.0–0.4)
Eos: 2 %
Hematocrit: 38.4 % (ref 34.0–46.6)
Hemoglobin: 12.3 g/dL (ref 11.1–15.9)
Immature Grans (Abs): 0 10*3/uL (ref 0.0–0.1)
Immature Granulocytes: 0 %
Lymphocytes Absolute: 2 10*3/uL (ref 0.7–3.1)
Lymphs: 28 %
MCH: 26.7 pg (ref 26.6–33.0)
MCHC: 32 g/dL (ref 31.5–35.7)
MCV: 83 fL (ref 79–97)
Monocytes Absolute: 0.4 10*3/uL (ref 0.1–0.9)
Monocytes: 5 %
Neutrophils Absolute: 4.5 10*3/uL (ref 1.4–7.0)
Neutrophils: 65 %
Platelets: 319 10*3/uL (ref 150–450)
RBC: 4.61 x10E6/uL (ref 3.77–5.28)
RDW: 13.6 % (ref 11.7–15.4)
WBC: 7 10*3/uL (ref 3.4–10.8)

## 2020-09-09 LAB — HEMOGLOBIN A1C
Est. average glucose Bld gHb Est-mCnc: 126 mg/dL
Hgb A1c MFr Bld: 6 % — ABNORMAL HIGH (ref 4.8–5.6)

## 2020-09-09 LAB — LIPID PANEL
Chol/HDL Ratio: 4.7 ratio — ABNORMAL HIGH (ref 0.0–4.4)
Cholesterol, Total: 224 mg/dL — ABNORMAL HIGH (ref 100–199)
HDL: 48 mg/dL (ref >39)
LDL Chol Calc (NIH): 153 mg/dL — ABNORMAL HIGH (ref 0–99)
Triglycerides: 127 mg/dL (ref 0–149)
VLDL Cholesterol Cal: 23 mg/dL (ref 5–40)

## 2020-09-09 LAB — VITAMIN B12: Vitamin B-12: >2000 pg/mL — ABNORMAL HIGH (ref 232–1245)

## 2020-09-09 LAB — HEPATITIS C ANTIBODY: Hep C Virus Ab: <0.1 s/co ratio (ref 0.0–0.9)

## 2020-09-09 LAB — TSH: TSH: 1.41 u[IU]/mL (ref 0.450–4.500)

## 2020-09-09 LAB — VITAMIN D 25 HYDROXY (VIT D DEFICIENCY, FRACTURES): Vit D, 25-Hydroxy: 20.6 ng/mL — ABNORMAL LOW (ref 30.0–100.0)

## 2020-09-10 ENCOUNTER — Telehealth: Payer: Self-pay

## 2020-09-10 NOTE — Telephone Encounter (Signed)
Written by Erasmo Downer, MD on 09/09/2020 3:24 PM EDT View Full Comments Seen by patient Roderic Ovens on 09/09/2020 3:39 PM

## 2020-09-10 NOTE — Telephone Encounter (Signed)
-----   Message from Erasmo Downer, MD sent at 09/09/2020  3:24 PM EDT ----- Normal/stable labs, except A1c remains in prediabetic range at 6.0.  Cholesterol remains elevated, but do not recommend medication at this time.  I do recommend diet low in saturated fat and regular exercise - 30 min at least 5 times per week.  Vitamin D level remains low at 20.6.  Recommend taking OTC vitamin D3 1000 to 2000 units daily.  If she is already doing this, she can increase to 5000 mg daily.  B12 level is actually high, so she can decrease her dose of supplement by half.

## 2020-09-12 ENCOUNTER — Encounter: Payer: Self-pay | Admitting: Family Medicine

## 2020-09-12 DIAGNOSIS — E669 Obesity, unspecified: Secondary | ICD-10-CM

## 2020-09-12 DIAGNOSIS — R7303 Prediabetes: Secondary | ICD-10-CM

## 2020-09-17 ENCOUNTER — Telehealth: Payer: Self-pay | Admitting: Family Medicine

## 2020-09-17 NOTE — Telephone Encounter (Signed)
Patient is calling regarding Rybelsus, The PA was denied. And patient states that she would not qualify for a savings card. Is there a chance that another medication could be recommended? Patient states that it has been over 10 days awaiting more information.   Please advise CB- [phone]

## 2020-09-17 NOTE — Telephone Encounter (Signed)
Please advise 

## 2020-09-17 NOTE — Telephone Encounter (Signed)
Can you see if we have any Rybelsus savings cards around?

## 2020-09-20 NOTE — Telephone Encounter (Signed)
You do not need to have low income to qualify for savings card.  Can be found here. All adult commercially insured patients are eligible.  We discussed when it was prescribed that insurance was unlikely to cover it.  [email or web address]://[email or web address]/savings-and-support.[email or web address]

## 2020-09-20 NOTE — Telephone Encounter (Signed)
Pt called saying she went to the website and understands if she has insurance she can not use the card.  RV#[phone]

## 2020-09-21 ENCOUNTER — Telehealth: Payer: Self-pay

## 2020-09-21 NOTE — Telephone Encounter (Signed)
Copied from CRM [id number]. Topic: General - Other >> Sep 21, 2020 10:43 AM Dalphine Handing A wrote: Patient stated that she has been waiting on a callback from nurse and trying to get a prescription for over a week. Please advise

## 2020-09-21 NOTE — Telephone Encounter (Signed)
Spoke with patient and she has several questions about the Rybulsus. She is wanting to possibly change to a different medication due to having so much trouble with her insurance and the pharmacy. She preferred to do a virtual visit with you to discuss as she feels the communication with you has been altered. She reports that it has taken almost a week for someone to get back with her. Expressed apologies and appt scheduled this week to discuss.

## 2020-09-23 ENCOUNTER — Encounter: Payer: Self-pay | Admitting: Family Medicine

## 2020-09-23 ENCOUNTER — Telehealth (INDEPENDENT_AMBULATORY_CARE_PROVIDER_SITE_OTHER): Payer: No Typology Code available for payment source | Admitting: Family Medicine

## 2020-09-23 ENCOUNTER — Other Ambulatory Visit: Payer: Self-pay

## 2020-09-23 DIAGNOSIS — E669 Obesity, unspecified: Secondary | ICD-10-CM | POA: Diagnosis not present

## 2020-09-23 DIAGNOSIS — R7303 Prediabetes: Secondary | ICD-10-CM | POA: Diagnosis not present

## 2020-09-23 MED ORDER — OZEMPIC (0.25 OR 0.5 MG/DOSE) 2 MG/1.5ML ~~LOC~~ SOPN: 0.5000 mg | PEN_INJECTOR | SUBCUTANEOUS | 3 refills | Status: DC

## 2020-09-23 NOTE — Assessment & Plan Note (Signed)
Discussed importance of healthy weight management Discussed diet and exercise  Rybelsus PA was denied by insurance Discussed option to appeal to insurance company Discussed using savings card for OOP expense instead Will try to get Ozempic covered Declines referral to healthy weight and wellness

## 2020-09-23 NOTE — Telephone Encounter (Signed)
OK to send in [facility]empic [facility].25mg  [facility] and then [facility]em[facility] 0.5mg  weekly for 3 month supply with 2 refills to [facility]. Thanks! Please let patient know when it's done.

## 2020-09-23 NOTE — Progress Notes (Signed)
MyChart Video Visit    Virtual Visit via Video Note   This visit type was conducted due to national recommendations for restrictions regarding the COVID-19 Pandemic (e.g. social distancing) in an effort to limit this patient's exposure and mitigate transmission in our community. This patient is at least at moderate risk for complications without adequate follow up. This format is felt to be most appropriate for this patient at this time. Physical exam was limited by quality of the video and audio technology used for the visit.    Patient location: home Provider location: [facility] Persons involved in the visit: patient, provider  I discussed the limitations of evaluation and management by telemedicine and the availability of in person appointments. The patient expressed understanding and agreed to proceed.  Patient: Shelly Coleman   DOB: 12-05-1985   34 y.o. Female  MRN: [id number] Visit Date: 09/23/2020  Today's healthcare provider: Shirlee Latch, MD   Chief Complaint  Patient presents with   Prediabetes   Obesity   Subjective    HPI   States she is having trouble getting the prescription for Rybelsus. PA was declined.  She states that co-pay card will not work for her.  She states that Rybelsus is covered by her insurance for her husband.  She is exercising more and meeting with nutritionist. She wants the help with weight loss.  She quit her job to focus on [employer]  Social History   Tobacco Use   Smoking status: Never Smoker   Smokeless tobacco: Never Used  Building services engineer Use: Never used  Substance Use Topics   Alcohol use: Yes    Comment: rare   Drug use: No      Medications: Outpatient Medications Prior to Visit  Medication Sig   Cholecalciferol (VITAMIN D3) 5000 units CAPS Take 1 capsule by mouth daily.   levothyroxine (SYNTHROID) 50 MCG tablet TAKE 1 TABLET BY MOUTH  DAILY   Multiple Vitamin (MULTIVITAMIN WITH MINERALS)  TABS tablet Take 1 tablet by mouth daily.   [DISCONTINUED] Semaglutide (RYBELSUS) 3 MG TABS Take 3 mg by mouth daily.   No facility-administered medications prior to visit.    Review of Systems  Constitutional: Negative.   Respiratory: Negative.   Cardiovascular: Negative.   Neurological: Negative.   Psychiatric/Behavioral: Negative.       Objective    There were no vitals taken for this visit.   Physical Exam Constitutional:      General: She is not in acute distress.    Appearance: Normal appearance.  Pulmonary:     Effort: Pulmonary effort is normal. No respiratory distress.  Neurological:     Mental Status: She is alert and oriented to person, place, and time. Mental status is at baseline.  Psychiatric:        Mood and Affect: Mood normal.        Behavior: Behavior normal.        Assessment & Plan     Problem List Items Addressed This Visit      Other   Obesity (BMI 30.0-34.9) - Primary    Discussed importance of healthy weight management Discussed diet and exercise  Rybelsus PA was denied by insurance Discussed option to appeal to insurance company Discussed using savings card for OOP expense instead Will try to get Ozempic covered Declines referral to healthy weight and wellness      Relevant Medications   Semaglutide,0.25 or 0.5MG /DOS, (OZEMPIC, 0.25 OR 0.5 MG/DOSE,) 2 MG/1.5ML  SOPN   Prediabetes    Encourage low carb diet Reassured that prediabetes does not carry secondary risks of DM Could consider metformin if Ozempic not covered, but discussed that you do not usually need a medicaito nfor prediabetes.      Relevant Medications   Semaglutide,0.25 or 0.5MG /DOS, (OZEMPIC, 0.25 OR 0.5 MG/DOSE,) 2 MG/1.5ML SOPN       No follow-ups on file.      I discussed the assessment and treatment plan with the patient. The patient was provided an opportunity to ask questions and all were answered. The patient agreed with the plan and demonstrated an  understanding of the instructions.   The patient was advised to call back or seek an in-person evaluation if the symptoms worsen or if the condition fails to improve as anticipated.  I provided 30 minutes of non-face-to-face time during this encounter.  I, Shirlee Latch, MD, have reviewed all documentation for this visit. The documentation on 09/23/20 for the exam, diagnosis, procedures, and orders are all accurate and complete.   Shelly Coleman, Shelly Schlein, MD, MPH [facility]

## 2020-09-23 NOTE — Assessment & Plan Note (Signed)
Encourage low carb diet Reassured that prediabetes does not carry secondary risks of DM Could consider metformin if Ozempic not covered, but discussed that you do not usually need a medicaito nfor prediabetes.

## 2020-09-24 MED ORDER — OZEMPIC (0.25 OR 0.5 MG/DOSE) 2 MG/1.5ML ~~LOC~~ SOPN: 0.5000 mg | PEN_INJECTOR | SUBCUTANEOUS | 1 refills | Status: DC

## 2020-12-04 ENCOUNTER — Telehealth: Payer: Self-pay | Admitting: Family Medicine

## 2020-12-06 NOTE — Telephone Encounter (Signed)
[facility] [address] Unable to leave a message due to VM box full.

## 2020-12-06 NOTE — Telephone Encounter (Signed)
Patient called to ask if she could receive a courtesy refill on her medication until it is received from [facility].  She stated that it won't get to her until 01/31.  Please advise and call patient to confirm.  CB# [phone]

## 2020-12-06 NOTE — Telephone Encounter (Signed)
Ok to send in refills? Please advise. Thanks!

## 2020-12-06 NOTE — Telephone Encounter (Signed)
Yes ok to send in rx thanks

## 2020-12-09 ENCOUNTER — Ambulatory Visit: Payer: Self-pay | Admitting: Family Medicine

## 2020-12-27 ENCOUNTER — Ambulatory Visit (INDEPENDENT_AMBULATORY_CARE_PROVIDER_SITE_OTHER): Payer: 59 | Admitting: Family Medicine

## 2020-12-27 ENCOUNTER — Encounter: Payer: Self-pay | Admitting: Family Medicine

## 2020-12-27 ENCOUNTER — Other Ambulatory Visit: Payer: Self-pay

## 2020-12-27 VITALS — BP 122/83 | HR 80 | Temp 98.0°F | Wt 202.0 lb

## 2020-12-27 DIAGNOSIS — E669 Obesity, unspecified: Secondary | ICD-10-CM | POA: Diagnosis not present

## 2020-12-27 DIAGNOSIS — R7303 Prediabetes: Secondary | ICD-10-CM | POA: Diagnosis not present

## 2020-12-27 DIAGNOSIS — F321 Major depressive disorder, single episode, moderate: Secondary | ICD-10-CM | POA: Diagnosis not present

## 2020-12-27 DIAGNOSIS — F411 Generalized anxiety disorder: Secondary | ICD-10-CM

## 2020-12-27 DIAGNOSIS — E66811 Obesity, class 1: Secondary | ICD-10-CM

## 2020-12-27 MED ORDER — OZEMPIC (1 MG/DOSE) 2 MG/1.5ML ~~LOC~~ SOPN: 1.0000 mg | PEN_INJECTOR | SUBCUTANEOUS | 1 refills | Status: DC

## 2020-12-27 NOTE — Progress Notes (Signed)
Established patient visit   Patient: Shelly Coleman   DOB: 1985-11-15   35 y.o. Female  MRN: [id number] Visit Date: 12/27/2020  Today's healthcare provider: Shirlee Latch, MD   Chief Complaint  Patient presents with   Weight Loss   Obesity    I,Taiz Bickle,acting as a scribe for Shirlee Latch, MD.,have documented all relevant documentation on the behalf of Shirlee Latch, MD,as directed by  Shirlee Latch, MD while in the presence of Shirlee Latch, MD.  Subjective    HPI   Follow up for Obesity & Weight Loss  The patient was last seen for this 3 months ago. Changes made at last visit include started Ozempic 0.5mg  weekly.  She reports good compliance with treatment. She feels that condition is changing some, she lose 6 pounds. She is not having side effects.    ----------------------------------------------------------------------------------------- Continues to be interested in seeing a therapist for her anxiety. 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 Hoping to go and visit family in [place] for 1 month and thinks this will help her anxiety some. Constantly worrying about other people judging her.   Social History   Tobacco Use   Smoking status: Never Smoker   Smokeless tobacco: Never Used  Vaping Use   Vaping Use: Never used  Substance Use Topics   Alcohol use: Yes    Comment: rare   Drug use: No       Medications: Outpatient Medications Prior to Visit  Medication Sig   Cholecalciferol (VITAMIN D3) 5000 units CAPS Take 1 capsule by mouth daily.   levothyroxine (SYNTHROID) 50 MCG tablet TAKE 1 TABLET BY MOUTH  DAILY   Multiple Vitamin (MULTIVITAMIN WITH MINERALS) TABS tablet Take 1 tablet by mouth daily.   [DISCONTINUED] Semaglutide,0.25 or 0.5MG /DOS, (OZEMPIC, 0.25 OR 0.5 MG/DOSE,) 2 MG/1.5ML SOPN Inject 0.5 mg into the skin once a week.   No facility-administered  medications prior to visit.    Review of Systems  Constitutional: Negative.   Respiratory: Negative.   Cardiovascular: Negative.   Hematological: Negative.        Objective    BP 122/83 (BP Location: Left Arm, Patient Position: Sitting, Cuff Size: Large)    Pulse 80    Temp 98 F (36.7 C) (Oral)    Wt 202 lb (91.6 kg)    SpO2 100%    BMI 34.67 kg/m     Physical Exam Vitals reviewed.  Constitutional:      General: She is not in acute distress.    Appearance: Normal appearance. She is well-developed. She is not diaphoretic.  HENT:     Head: Normocephalic and atraumatic.  Eyes:     General: No scleral icterus.    Conjunctiva/sclera: Conjunctivae normal.  Neck:     Thyroid: No thyromegaly.  Cardiovascular:     Rate and Rhythm: Normal rate and regular rhythm.     Pulses: Normal pulses.     Heart sounds: Normal heart sounds. No murmur heard.   Pulmonary:     Effort: Pulmonary effort is normal. No respiratory distress.     Breath sounds: Normal breath sounds. No wheezing, rhonchi or rales.  Musculoskeletal:     Cervical back: Neck supple.     Right lower leg: No edema.     Left lower leg: No edema.  Lymphadenopathy:     Cervical: No cervical adenopathy.  Skin:    General: Skin is warm and dry.  Findings: No rash.  Neurological:     Mental Status: She is alert and oriented to person, place, and time. Mental status is at baseline.  Psychiatric:        Mood and Affect: Affect normal. Mood is anxious.        Speech: Speech normal.        Behavior: Behavior normal.        Thought Content: Thought content does not include suicidal ideation.       No results found for any visits on 12/27/20.  Assessment & Plan     Problem List Items Addressed This Visit      Other   Obesity (BMI 30.0-34.9) - Primary    Discussed importance of healthy weight management Discussed diet and exercise Congratulated on weight loss Tolerating ozempic well Increase dose to 1mg   weekly At f/u, reassess need for further dose titration      Prediabetes    Recommend low carb diet Recheck A1c at next visit Continue ozempic as above      Current moderate episode of major depressive disorder without prior episode (HCC)    Chronic and stable Declines medications at this time Discussed improtance of therapy and gave some recommendations Patient will plan to call this week to set up appt No SI/HI - safe to self      GAD (generalized anxiety disorder)    Chronic and uncontrolled Declines medications at this time Encouraged therapy and gave some recommendations that she will reach out to          Return in about 3 months (around 03/26/2021) for chronic disease f/u.      I, 03/28/2021, MD, have reviewed all documentation for this visit. The documentation on 12/28/20 for the exam, diagnosis, procedures, and orders are all accurate and complete.   Jp Eastham, 12/30/20, MD, MPH [facility]

## 2020-12-27 NOTE — Patient Instructions (Signed)
Check with [facility]

## 2020-12-28 NOTE — Assessment & Plan Note (Signed)
Chronic and stable Declines medications at this time Discussed improtance of therapy and gave some recommendations Patient will plan to call this week to set up appt No SI/HI - safe to self

## 2020-12-28 NOTE — Assessment & Plan Note (Signed)
Chronic and uncontrolled Declines medications at this time Encouraged therapy and gave some recommendations that she will reach out to

## 2020-12-28 NOTE — Assessment & Plan Note (Signed)
Recommend low carb diet Recheck A1c at next visit Continue ozempic as above

## 2020-12-28 NOTE — Assessment & Plan Note (Signed)
Discussed importance of healthy weight management Discussed diet and exercise Congratulated on weight loss Tolerating ozempic well Increase dose to 1mg  weekly At f/u, reassess need for further dose titration

## 2021-04-25 ENCOUNTER — Ambulatory Visit: Payer: Self-pay | Admitting: *Deleted

## 2021-04-25 NOTE — Telephone Encounter (Signed)
Reason for Disposition  [1] MILD or MODERATE vomiting AND [2] present > 48 hours (2 days) (Exception: mild vomiting with associated diarrhea)  Answer Assessment - Initial Assessment Questions 1. VOMITING SEVERITY: "How many times have you vomited in the past 24 hours?"     - MILD:  1 - 2 times/day    - MODERATE: 3 - 5 times/day, decreased oral intake without significant weight loss or symptoms of dehydration    - SEVERE: 6 or more times/day, vomits everything or nearly everything, with significant weight loss, symptoms of dehydration      Yesterday and today.  I ate lunch and got a stomach ache yesterday and today then vomited.    2. ONSET: "When did the vomiting begin?"      Yesterday.    I'm eating normally.   No changes.   It happens about 30 minutes after I eat.  I vomit fluids too 3. FLUIDS: "What fluids or food have you vomited up today?" "Have you been able to keep any fluids down?"     All my lunch and yesterday too. I' 4. ABDOMINAL PAIN: "Are your having any abdominal pain?" If yes : "How bad is it and what does it feel like?" (e.g., crampy, dull, intermittent, constant)      I'm having pain on the left side of my stomach.     5. DIARRHEA: "Is there any diarrhea?" If Yes, ask: "How many times today?"      No diarrhea 6. CONTACTS: "Is there anyone else in the family with the same symptoms?"      No 7. CAUSE: "What do you think is causing your vomiting?"     No idea 8. HYDRATION STATUS: "Any signs of dehydration?" (e.g., dry mouth [not only dry lips], too weak to stand) "When did you last urinate?"     No 9. OTHER SYMPTOMS: "Do you have any other symptoms?" (e.g., fever, headache, vertigo, vomiting blood or coffee grounds, recent head injury)     No    It's the food I just ate for lunch yesterday and today 10. PREGNANCY: "Is there any chance you are pregnant?" "When was your last menstrual period?"       No  Protocols used: Vomiting-A-AH

## 2021-04-25 NOTE — Telephone Encounter (Signed)
Pt called in c/o vomiting up only her lunch for the past 2 days.  Sun and Mon.   Did not eat the same thing.   She ate lunch then approximately 30 minutes later her stomach starts to hurt and she vomits.   She is fine after vomiting.  Protocol is to be seen within 24 hours.   There are no appts available within the protocol timeframe.   I have sent a note requesting to see if she can be worked in sooner.   She denies any stomach or GI history.   Only abd surgery she has had was a C section 2 yrs ago.  I made an appt with Dr. Sullivan Lone for 04/28/2021 at 9:20 in office visit.

## 2021-04-26 ENCOUNTER — Ambulatory Visit: Payer: 59 | Admitting: Nurse Practitioner

## 2021-04-26 ENCOUNTER — Telehealth: Payer: Self-pay

## 2021-04-26 ENCOUNTER — Encounter: Payer: Self-pay | Admitting: Nurse Practitioner

## 2021-04-26 ENCOUNTER — Other Ambulatory Visit: Payer: Self-pay

## 2021-04-26 ENCOUNTER — Ambulatory Visit
Admission: RE | Admit: 2021-04-26 | Discharge: 2021-04-26 | Disposition: A | Discharge status: Home / Self Care | Payer: 59 | Source: Ambulatory Visit | Attending: Nurse Practitioner | Admitting: Nurse Practitioner

## 2021-04-26 VITALS — BP 124/79 | HR 77 | Temp 98.3°F | Wt 196.2 lb

## 2021-04-26 DIAGNOSIS — R3121 Asymptomatic microscopic hematuria: Secondary | ICD-10-CM

## 2021-04-26 DIAGNOSIS — R1032 Left lower quadrant pain: Secondary | ICD-10-CM

## 2021-04-26 DIAGNOSIS — N200 Calculus of kidney: Secondary | ICD-10-CM | POA: Diagnosis not present

## 2021-04-26 MED ORDER — TAMSULOSIN HCL 0.4 MG PO CAPS: 0.4000 mg | ORAL_CAPSULE | Freq: Every day | ORAL | 3 refills | Status: DC

## 2021-04-26 NOTE — Telephone Encounter (Signed)
Copied from CRM [id number]. Topic: General - Other >> Apr 26, 2021  4:41 PM Pawlus, Maxine Glenn A wrote: Reason for CRM: Pt was seen today at [facility] and called in regarding her urine results, pt stated she is in pain and would like to know the results as soon as possible.

## 2021-04-26 NOTE — Progress Notes (Addendum)
Acute Office Visit  Subjective:    Patient ID: Shelly Coleman, female    DOB: Jul 20, 1986, 35 y.o.   MRN: [id number]  Chief Complaint  Patient presents with   Flank Pain    Patient states she has been having left side pain and vomit that started. Pain and vomit comes and goes since Sunday. Went to urgent care last night and urine was positive for blood was told it could possibly be kidney stones     HPI Patient is in today for left lower quadrant abdominal pain with vomiting x2 days. She went to urgent care yesterday where she was noted to have 3+ hematuria. She was told to follow-up with her PCP.   ABDOMINAL PAIN   Duration:days, since Sunday Onset: sudden Severity: severe Quality: cramping and stabbing Location:  LUQ  Episode duration: 2-3 hours Radiation: no Frequency: a few times a day Alleviating factors: vomiting, ibuprofen Aggravating factors: unsure, sometimes when she eats Status: fluctuating Treatments attempted: antacids Fever: no Nausea: yes Vomiting: yes Weight loss: no Decreased appetite: yes Diarrhea: no Constipation: yes, last bowel movement this morning, needed to strain Blood in stool: no Heartburn: no Jaundice: no Rash: no Dysuria/urinary frequency: no Hematuria: no History of sexually transmitted disease: no Recurrent NSAID use: no   Past Medical History:  Diagnosis Date   Migraine headache with aura    Obesity (BMI 35.0-39.9 without comorbidity)    Thyroid disease    hypothyroidism   Vegetarian diet     Past Surgical History:  Procedure Laterality Date   CESAREAN SECTION N/A 03/20/2018   Procedure: CESAREAN SECTION- STAT;  Surgeon: Homero Fellers, MD;  Location: [facility] ORS;  Service: Obstetrics;  Laterality: N/A;    Family History  Problem Relation Age of Onset   Leukemia Maternal Grandfather    Hypertension Father 48   Diabetes Father 32   Diabetes Paternal Grandmother 39   Thyroid disease Paternal Grandmother     Hypertension Paternal Grandfather 37   Diabetes Paternal Grandfather 61   Thyroid disease Mother    Healthy Brother    Healthy Son    Breast cancer Neg Hx    Colon cancer Neg Hx     Social History   Socioeconomic History   Marital status: Married    Spouse name: Binay   Number of children: 1   Years of education: 18   Highest education level: Not on file  Occupational History   Occupation: mortgage     Comment: [employer]  Tobacco Use   Smoking status: Never   Smokeless tobacco: Never  Vaping Use   Vaping Use: Never used  Substance and Sexual Activity   Alcohol use: Yes    Comment: rare   Drug use: No   Sexual activity: Yes    Partners: Male    Birth control/protection: Condom  Other Topics Concern   Not on file  Social History Narrative   Not on file   Social Determinants of Health   Financial Resource Strain: Not on file  Food Insecurity: Not on file  Transportation Needs: Not on file  Physical Activity: Not on file  Stress: Not on file  Social Connections: Not on file  Intimate Partner Violence: Not on file    Outpatient Medications Prior to Visit  Medication Sig Dispense Refill   Cholecalciferol (VITAMIN D3) 5000 units CAPS Take 1 capsule by mouth daily.     ibuprofen (ADVIL) 800 MG tablet Take 800 mg by mouth 3 (three) times  daily.     levothyroxine (SYNTHROID) 50 MCG tablet TAKE 1 TABLET BY MOUTH  DAILY 90 tablet 0   Multiple Vitamin (MULTIVITAMIN WITH MINERALS) TABS tablet Take 1 tablet by mouth daily.     ondansetron (ZOFRAN-ODT) 4 MG disintegrating tablet Take 4 mg by mouth every 8 (eight) hours as needed.     Semaglutide, 1 MG/DOSE, (OZEMPIC, 1 MG/DOSE,) 2 MG/1.5ML SOPN Inject 1 mg into the skin once a week. 9 mL 1   No facility-administered medications prior to visit.    No Known Allergies  Review of Systems  Constitutional:  Positive for fatigue. Negative for fever.  HENT: Negative.    Eyes: Negative.   Respiratory: Negative.     Cardiovascular: Negative.   Gastrointestinal:  Positive for abdominal pain, nausea and vomiting. Negative for diarrhea.  Genitourinary: Negative.   Musculoskeletal: Negative.   Skin: Negative.   Neurological: Negative.       Objective:    Physical Exam Vitals and nursing note reviewed.  Constitutional:      General: She is not in acute distress.    Appearance: Normal appearance.  HENT:     Head: Normocephalic.  Eyes:     Conjunctiva/sclera: Conjunctivae normal.  Cardiovascular:     Rate and Rhythm: Normal rate and regular rhythm.     Pulses: Normal pulses.     Heart sounds: Normal heart sounds.  Pulmonary:     Effort: Pulmonary effort is normal.     Breath sounds: Normal breath sounds.  Abdominal:     General: Bowel sounds are normal.     Palpations: Abdomen is soft.     Tenderness: There is abdominal tenderness (LLQ).  Musculoskeletal:     Cervical back: Normal range of motion.  Skin:    General: Skin is warm.  Neurological:     General: No focal deficit present.     Mental Status: She is alert and oriented to person, place, and time.  Psychiatric:        Mood and Affect: Mood normal.        Behavior: Behavior normal.        Thought Content: Thought content normal.        Judgment: Judgment normal.    BP 124/79   Pulse 77   Temp 98.3 F (36.8 C)   Wt 196 lb 3.2 oz (89 kg)   SpO2 97%   BMI 33.68 kg/m  Wt Readings from Last 3 Encounters:  04/26/21 196 lb 3.2 oz (89 kg)  12/27/20 202 lb (91.6 kg)  09/06/20 208 lb (94.3 kg)    Health Maintenance Due  Topic Date Due   Pneumococcal Vaccine 71-56 Years old (1 - PCV) Never done   Zoster Vaccines- Shingrix (1 of 2) Never done   PAP SMEAR-Modifier  11/01/2020   COVID-19 Vaccine (2 - Moderna risk series) 11/01/2020    There are no preventive care reminders to display for this patient.   Lab Results  Component Value Date   TSH 1.410 09/08/2020   Lab Results  Component Value Date   WBC 8.2 04/26/2021    HGB 12.9 04/26/2021   HCT 39.5 04/26/2021   MCV 82 04/26/2021   PLT 331 04/26/2021   Lab Results  Component Value Date   NA 140 04/26/2021   K 4.2 04/26/2021   CO2 20 04/26/2021   GLUCOSE 96 04/26/2021   BUN 9 04/26/2021   CREATININE 0.68 04/26/2021   BILITOT 0.3 04/26/2021   ALKPHOS 53 04/26/2021  AST 8 04/26/2021   ALT 14 04/26/2021   PROT 7.2 04/26/2021   ALBUMIN 4.5 04/26/2021   CALCIUM 9.5 04/26/2021   ANIONGAP 10 03/20/2018   EGFR 116 04/26/2021   Lab Results  Component Value Date   CHOL 224 (H) 09/08/2020   Lab Results  Component Value Date   HDL 48 09/08/2020   Lab Results  Component Value Date   LDLCALC 153 (H) 09/08/2020   Lab Results  Component Value Date   TRIG 127 09/08/2020   Lab Results  Component Value Date   CHOLHDL 4.7 (H) 09/08/2020   Lab Results  Component Value Date   HGBA1C 6.0 (H) 09/08/2020       Assessment & Plan:   Problem List Items Addressed This Visit       Other   Left lower quadrant abdominal pain - Primary    Acute LLQ abdominal pain x2 days. With hematuria, nausea, and vomiting. Concern for kidney stone vs UTI vs ovarian cyst. Will recheck U/A and order CT abdomen/pelvis. Can take zofran and/or ibuprofen as needed as prescribed by urgent care. Will follow-up and treat based on results. Will check CMP and CBC.        Relevant Orders   CT Abdomen Pelvis Wo Contrast (Completed)   Comp Met (CMET) (Completed)   CBC with Differential (Completed)   Other Visit Diagnoses     Asymptomatic microscopic hematuria       U/A rechecked today, shows 2+ blood, trace leuks and bacteria.    Relevant Orders   Urinalysis, Routine w reflex microscopic   Urine Culture   Kidney stone       CT scan showed kidney stone in left ureter. Will place urgent referral to urology, start flomax, encourage fluids, strain urine. PDMP reviewed. Norco prn    Relevant Orders   Ambulatory referral to Urology        Meds ordered this encounter   Medications   tamsulosin (FLOMAX) 0.4 MG CAPS capsule    Sig: Take 1 capsule (0.4 mg total) by mouth daily.    Dispense:  30 capsule    Refill:  3    A total of 30 minutes were spent on this encounter today. When total time is documented, this includes both the face-to-face and non-face-to-face time personally spent before, during and after the visit on the date of the encounter.   Charyl Dancer, NP

## 2021-04-26 NOTE — Addendum Note (Signed)
Addended by: Rodman Pickle A on: 04/26/2021 04:49 PM   Modules accepted: Orders

## 2021-04-26 NOTE — Telephone Encounter (Signed)
Copied from CRM [id number]. Topic: General - Other >> Apr 26, 2021  4:38 PM Gaetana Michaelis A wrote: Reason for CRM: French Ana with [facility] has called to provide a report of patient's CT scan of their abdomen and pelvis  Please contact to further advise when available

## 2021-04-26 NOTE — Assessment & Plan Note (Addendum)
Acute LLQ abdominal pain x2 days. With hematuria, nausea, and vomiting. Concern for kidney stone vs UTI vs ovarian cyst. Will recheck U/A and order CT abdomen/pelvis. Can take zofran and/or ibuprofen as needed as prescribed by urgent care. Will follow-up and treat based on results. Will check CMP and CBC.

## 2021-04-27 ENCOUNTER — Ambulatory Visit
Admission: RE | Admit: 2021-04-27 | Discharge: 2021-04-27 | Disposition: A | Discharge status: Home / Self Care | Payer: 59 | Source: Ambulatory Visit | Attending: Urology | Admitting: Urology

## 2021-04-27 ENCOUNTER — Encounter: Payer: Self-pay | Admitting: Urology

## 2021-04-27 ENCOUNTER — Ambulatory Visit
Admission: RE | Admit: 2021-04-27 | Discharge: 2021-04-27 | Disposition: A | Discharge status: Home / Self Care | Payer: 59 | Attending: Urology | Admitting: Urology

## 2021-04-27 ENCOUNTER — Other Ambulatory Visit: Payer: Self-pay

## 2021-04-27 ENCOUNTER — Ambulatory Visit: Payer: 59 | Admitting: Urology

## 2021-04-27 VITALS — BP 125/81 | HR 64 | Ht 64.0 in | Wt 196.8 lb

## 2021-04-27 DIAGNOSIS — N201 Calculus of ureter: Secondary | ICD-10-CM | POA: Diagnosis present

## 2021-04-27 LAB — CBC WITH DIFFERENTIAL/PLATELET
Basophils Absolute: 0 10*3/uL (ref 0.0–0.2)
Basos: 0 %
EOS (ABSOLUTE): 0.1 10*3/uL (ref 0.0–0.4)
Eos: 1 %
Hematocrit: 39.5 % (ref 34.0–46.6)
Hemoglobin: 12.9 g/dL (ref 11.1–15.9)
Immature Grans (Abs): 0 10*3/uL (ref 0.0–0.1)
Immature Granulocytes: 0 %
Lymphocytes Absolute: 2.2 10*3/uL (ref 0.7–3.1)
Lymphs: 27 %
MCH: 26.7 pg (ref 26.6–33.0)
MCHC: 32.7 g/dL (ref 31.5–35.7)
MCV: 82 fL (ref 79–97)
Monocytes Absolute: 0.4 10*3/uL (ref 0.1–0.9)
Monocytes: 5 %
Neutrophils Absolute: 5.5 10*3/uL (ref 1.4–7.0)
Neutrophils: 67 %
Platelets: 331 10*3/uL (ref 150–450)
RBC: 4.83 x10E6/uL (ref 3.77–5.28)
RDW: 13.2 % (ref 11.7–15.4)
WBC: 8.2 10*3/uL (ref 3.4–10.8)

## 2021-04-27 LAB — URINALYSIS, ROUTINE W REFLEX MICROSCOPIC
Bilirubin, UA: NEGATIVE
Glucose, UA: NEGATIVE
Ketones, UA: NEGATIVE
Nitrite, UA: NEGATIVE
Protein,UA: NEGATIVE
Specific Gravity, UA: 1.02 (ref 1.005–1.030)
Urobilinogen, Ur: 0.2 mg/dL (ref 0.2–1.0)
pH, UA: 6 (ref 5.0–7.5)

## 2021-04-27 LAB — COMPREHENSIVE METABOLIC PANEL
ALT: 14 IU/L (ref 0–32)
AST: 8 IU/L (ref 0–40)
Albumin/Globulin Ratio: 1.7 (ref 1.2–2.2)
Albumin: 4.5 g/dL (ref 3.8–4.8)
Alkaline Phosphatase: 53 IU/L (ref 44–121)
BUN/Creatinine Ratio: 13 (ref 9–23)
BUN: 9 mg/dL (ref 6–20)
Bilirubin Total: 0.3 mg/dL (ref 0.0–1.2)
CO2: 20 mmol/L (ref 20–29)
Calcium: 9.5 mg/dL (ref 8.7–10.2)
Chloride: 103 mmol/L (ref 96–106)
Creatinine, Ser: 0.68 mg/dL (ref 0.57–1.00)
Globulin, Total: 2.7 g/dL (ref 1.5–4.5)
Glucose: 96 mg/dL (ref 65–99)
Potassium: 4.2 mmol/L (ref 3.5–5.2)
Sodium: 140 mmol/L (ref 134–144)
Total Protein: 7.2 g/dL (ref 6.0–8.5)
eGFR: 116 mL/min/{1.73_m2} (ref >59)

## 2021-04-27 LAB — MICROSCOPIC EXAMINATION

## 2021-04-27 MED ORDER — HYDROCODONE-ACETAMINOPHEN 10-325 MG PO TABS: 1.0000 | ORAL_TABLET | Freq: Four times a day (QID) | ORAL | 0 refills | Status: AC | PRN

## 2021-04-27 NOTE — Progress Notes (Signed)
04/27/2021 2:40 PM   Shelly Coleman 09-13-1986 [id number]  Referring provider: Erasmo Downer, MD [address] [place],  [place] [address]  Chief Complaint  Patient presents with   Nephrolithiasis    HPI: 35 year old female who presents today for further evaluation of a 6 mm proximal stone.  She started having acute severe left lower quadrant pain 3 days ago.  She was seen yesterday by her primary care provider at which time she was experiencing hematuria, nausea and vomiting.  She underwent CT abdomen pelvis without contrast indicating a 6 mm left proximal ureteral calculus with proximal hydroureteronephrosis.  Urinalysis today shows 3-10 red blood cells per high-powered field, otherwise unremarkable.  Labs including BMP and CBC was unremarkable.  She continues to have severe left lower quadrant pain.  No nausea or vomiting.  No fevers or chills.  No dysuria today.  I did send her for a KUB today.  She last took NSAIDs just yesterday.  No previous history of kidney stones.   PMH: Past Medical History:  Diagnosis Date   Migraine headache with aura    Obesity (BMI 35.0-39.9 without comorbidity)    Thyroid disease    hypothyroidism   Vegetarian diet     Surgical History: Past Surgical History:  Procedure Laterality Date   CESAREAN SECTION N/A 03/20/2018   Procedure: CESAREAN SECTION- STAT;  Surgeon: Natale Milch, MD;  Location: [facility] ORS;  Service: Obstetrics;  Laterality: N/A;    Home Medications:  Allergies as of 04/27/2021   No Known Allergies      Medication List        Accurate as of April 27, 2021  2:40 PM. If you have any questions, ask your nurse or doctor.          HYDROcodone-acetaminophen 10-325 MG tablet Commonly known as: NORCO Take 1 tablet by mouth every 6 (six) hours as needed for up to 5 days.   ibuprofen 800 MG tablet Commonly known as: ADVIL Take 800 mg by mouth 3 (three) times daily.   levothyroxine 50  MCG tablet Commonly known as: SYNTHROID TAKE 1 TABLET BY MOUTH  DAILY   multivitamin with minerals Tabs tablet Take 1 tablet by mouth daily.   ondansetron 4 MG disintegrating tablet Commonly known as: ZOFRAN-ODT Take 4 mg by mouth every 8 (eight) hours as needed.   Ozempic (1 MG/DOSE) 2 MG/1.5ML Sopn Generic drug: Semaglutide (1 MG/DOSE) Inject 1 mg into the skin once a week.   tamsulosin 0.4 MG Caps capsule Commonly known as: FLOMAX Take 1 capsule (0.4 mg total) by mouth daily.   Vitamin D3 125 MCG (5000 UT) Caps Take 1 capsule by mouth daily.        Allergies: No Known Allergies  Family History: Family History  Problem Relation Age of Onset   Leukemia Maternal Grandfather    Hypertension Father 17   Diabetes Father 91   Diabetes Paternal Grandmother 22   Thyroid disease Paternal Grandmother    Hypertension Paternal Grandfather 51   Diabetes Paternal Grandfather 64   Thyroid disease Mother    Healthy Brother    Healthy Son    Breast cancer Neg Hx    Colon cancer Neg Hx     Social History:  reports that she has never smoked. She has never used smokeless tobacco. She reports current alcohol use. She reports that she does not use drugs.   Physical Exam: There were no vitals taken for this visit.  Constitutional:  Alert and oriented, No acute distress. HEENT: [place] AT, moist mucus membranes.  Trachea midline, no masses. Cardiovascular: No clubbing, cyanosis, or edema. Respiratory: Normal respiratory effort, no increased work of breathing. Skin: No rashes, bruises or suspicious lesions. Neurologic: Grossly intact, no focal deficits, moving all 4 extremities. Psychiatric: Normal mood and affect.  Laboratory Data: Lab Results  Component Value Date   WBC 8.2 04/26/2021   HGB 12.9 04/26/2021   HCT 39.5 04/26/2021   MCV 82 04/26/2021   PLT 331 04/26/2021    Lab Results  Component Value Date   CREATININE 0.68 04/26/2021     Lab Results  Component Value  Date   HGBA1C 6.0 (H) 09/08/2020    Urinalysis    Component Value Date/Time   APPEARANCEUR Clear 04/26/2021 1401   GLUCOSEU Negative 04/26/2021 1401   BILIRUBINUR Negative 04/26/2021 1401   PROTEINUR Negative 04/26/2021 1401   NITRITE Negative 04/26/2021 1401   LEUKOCYTESUR Trace (A) 04/26/2021 1401    Lab Results  Component Value Date   LABMICR See below: 04/26/2021   WBCUA 0-5 04/26/2021   LABEPIT 0-10 04/26/2021   BACTERIA Few (A) 04/26/2021    Pertinent Imaging:  IMPRESSION: Mild left hydronephrosis and proximal hydroureter, secondary to a 4 x 6 mm stone in the proximal left ureter.     Electronically Signed   By: Jasmine Pang M.D.   On: 04/26/2021 16:30  The above CT scan was personally reviewed today, agree with radiologic interpretation of the images.  No additional upper tract stones.  Stone to skin distance 10 cm, 880 Hounsfield units.  She was sent for KUB to assess the location of her stone today which appears to be in unchanged location.  This images were personally reviewed.   Assessment & Plan:    1. Left ureteral stone 6 mm left proximal ureteral stone  We discussed various treatment options for urolithiasis including observation with or without medical expulsive therapy, shockwave lithotripsy (SWL), ureteroscopy and laser lithotripsy with stent placement.   We discussed that management is based on stone size, location, density, patient co-morbidities, and patient preference.    Stones <75mm in size have a >80% spontaneous passage rate. Data surrounding the use of tamsulosin for medical expulsive therapy is controversial, but meta analyses suggests it is most efficacious for distal stones between 5-15mm in size. Possible side effects include dizziness/lightheadedness, and retrograde ejaculation.  At this point, she continues to have fairly significant pain and has a young child at home.  She would like to have the stone taken care of sooner rather than  later.  Unfortunately, she is taken NSAIDs yesterday this and will be candidate for lithotripsy tomorrow based on KUB results.   SWL has a lower stone free rate in a single procedure, but also a lower complication rate compared to ureteroscopy and avoids a stent and associated stent related symptoms. Possible complications include renal hematoma, steinstrasse, and need for additional treatment. We discussed the role of his increased skin to stone distance can lead to decreased efficacy with shockwave lithotripsy.   Ureteroscopy with laser lithotripsy and stent placement has a higher stone free rate than SWL in a single procedure, however increased complication rate including possible infection, ureteral injury, bleeding, and stent related morbidity. Common stent related symptoms include dysuria, urgency/frequency, and flank pain.   After an extensive discussion of the risks and benefits of the above treatment options, the patient would like to proceed with left ureteroscopy, laser lithotripsy and stent placement.  She is agreed to have this done with my partner Dr. Richardo Hanks.   Vanna Scotland, MD  [facility] [address], [address] [place], [place] [address] [phone]

## 2021-04-27 NOTE — Telephone Encounter (Signed)
Patient and provider aware of results.

## 2021-04-27 NOTE — Addendum Note (Signed)
Addended by: Rodman Pickle A on: 04/27/2021 08:58 AM   Modules accepted: Orders

## 2021-04-27 NOTE — Telephone Encounter (Signed)
Results given.

## 2021-04-27 NOTE — H&P (View-Only) (Signed)
04/27/2021 2:40 PM   Shelly Coleman 09-13-1986 [id number]  Referring provider: Erasmo Downer, MD [address] [place],  [place] [address]  Chief Complaint  Patient presents with   Nephrolithiasis    HPI: 35 year old female who presents today for further evaluation of a 6 mm proximal stone.  She started having acute severe left lower quadrant pain 3 days ago.  She was seen yesterday by her primary care provider at which time she was experiencing hematuria, nausea and vomiting.  She underwent CT abdomen pelvis without contrast indicating a 6 mm left proximal ureteral calculus with proximal hydroureteronephrosis.  Urinalysis today shows 3-10 red blood cells per high-powered field, otherwise unremarkable.  Labs including BMP and CBC was unremarkable.  She continues to have severe left lower quadrant pain.  No nausea or vomiting.  No fevers or chills.  No dysuria today.  I did send her for a KUB today.  She last took NSAIDs just yesterday.  No previous history of kidney stones.   PMH: Past Medical History:  Diagnosis Date   Migraine headache with aura    Obesity (BMI 35.0-39.9 without comorbidity)    Thyroid disease    hypothyroidism   Vegetarian diet     Surgical History: Past Surgical History:  Procedure Laterality Date   CESAREAN SECTION N/A 03/20/2018   Procedure: CESAREAN SECTION- STAT;  Surgeon: Natale Milch, MD;  Location: [facility] ORS;  Service: Obstetrics;  Laterality: N/A;    Home Medications:  Allergies as of 04/27/2021   No Known Allergies      Medication List        Accurate as of April 27, 2021  2:40 PM. If you have any questions, ask your nurse or doctor.          HYDROcodone-acetaminophen 10-325 MG tablet Commonly known as: NORCO Take 1 tablet by mouth every 6 (six) hours as needed for up to 5 days.   ibuprofen 800 MG tablet Commonly known as: ADVIL Take 800 mg by mouth 3 (three) times daily.   levothyroxine 50  MCG tablet Commonly known as: SYNTHROID TAKE 1 TABLET BY MOUTH  DAILY   multivitamin with minerals Tabs tablet Take 1 tablet by mouth daily.   ondansetron 4 MG disintegrating tablet Commonly known as: ZOFRAN-ODT Take 4 mg by mouth every 8 (eight) hours as needed.   Ozempic (1 MG/DOSE) 2 MG/1.5ML Sopn Generic drug: Semaglutide (1 MG/DOSE) Inject 1 mg into the skin once a week.   tamsulosin 0.4 MG Caps capsule Commonly known as: FLOMAX Take 1 capsule (0.4 mg total) by mouth daily.   Vitamin D3 125 MCG (5000 UT) Caps Take 1 capsule by mouth daily.        Allergies: No Known Allergies  Family History: Family History  Problem Relation Age of Onset   Leukemia Maternal Grandfather    Hypertension Father 17   Diabetes Father 91   Diabetes Paternal Grandmother 22   Thyroid disease Paternal Grandmother    Hypertension Paternal Grandfather 51   Diabetes Paternal Grandfather 64   Thyroid disease Mother    Healthy Brother    Healthy Son    Breast cancer Neg Hx    Colon cancer Neg Hx     Social History:  reports that she has never smoked. She has never used smokeless tobacco. She reports current alcohol use. She reports that she does not use drugs.   Physical Exam: There were no vitals taken for this visit.  Constitutional:  Alert and oriented, No acute distress. HEENT: [place] AT, moist mucus membranes.  Trachea midline, no masses. Cardiovascular: No clubbing, cyanosis, or edema. Respiratory: Normal respiratory effort, no increased work of breathing. Skin: No rashes, bruises or suspicious lesions. Neurologic: Grossly intact, no focal deficits, moving all 4 extremities. Psychiatric: Normal mood and affect.  Laboratory Data: Lab Results  Component Value Date   WBC 8.2 04/26/2021   HGB 12.9 04/26/2021   HCT 39.5 04/26/2021   MCV 82 04/26/2021   PLT 331 04/26/2021    Lab Results  Component Value Date   CREATININE 0.68 04/26/2021     Lab Results  Component Value  Date   HGBA1C 6.0 (H) 09/08/2020    Urinalysis    Component Value Date/Time   APPEARANCEUR Clear 04/26/2021 1401   GLUCOSEU Negative 04/26/2021 1401   BILIRUBINUR Negative 04/26/2021 1401   PROTEINUR Negative 04/26/2021 1401   NITRITE Negative 04/26/2021 1401   LEUKOCYTESUR Trace (A) 04/26/2021 1401    Lab Results  Component Value Date   LABMICR See below: 04/26/2021   WBCUA 0-5 04/26/2021   LABEPIT 0-10 04/26/2021   BACTERIA Few (A) 04/26/2021    Pertinent Imaging:  IMPRESSION: Mild left hydronephrosis and proximal hydroureter, secondary to a 4 x 6 mm stone in the proximal left ureter.     Electronically Signed   By: Jasmine Pang M.D.   On: 04/26/2021 16:30  The above CT scan was personally reviewed today, agree with radiologic interpretation of the images.  No additional upper tract stones.  Stone to skin distance 10 cm, 880 Hounsfield units.  She was sent for KUB to assess the location of her stone today which appears to be in unchanged location.  This images were personally reviewed.   Assessment & Plan:    1. Left ureteral stone 6 mm left proximal ureteral stone  We discussed various treatment options for urolithiasis including observation with or without medical expulsive therapy, shockwave lithotripsy (SWL), ureteroscopy and laser lithotripsy with stent placement.   We discussed that management is based on stone size, location, density, patient co-morbidities, and patient preference.    Stones <75mm in size have a >80% spontaneous passage rate. Data surrounding the use of tamsulosin for medical expulsive therapy is controversial, but meta analyses suggests it is most efficacious for distal stones between 5-15mm in size. Possible side effects include dizziness/lightheadedness, and retrograde ejaculation.  At this point, she continues to have fairly significant pain and has a young child at home.  She would like to have the stone taken care of sooner rather than  later.  Unfortunately, she is taken NSAIDs yesterday this and will be candidate for lithotripsy tomorrow based on KUB results.   SWL has a lower stone free rate in a single procedure, but also a lower complication rate compared to ureteroscopy and avoids a stent and associated stent related symptoms. Possible complications include renal hematoma, steinstrasse, and need for additional treatment. We discussed the role of his increased skin to stone distance can lead to decreased efficacy with shockwave lithotripsy.   Ureteroscopy with laser lithotripsy and stent placement has a higher stone free rate than SWL in a single procedure, however increased complication rate including possible infection, ureteral injury, bleeding, and stent related morbidity. Common stent related symptoms include dysuria, urgency/frequency, and flank pain.   After an extensive discussion of the risks and benefits of the above treatment options, the patient would like to proceed with left ureteroscopy, laser lithotripsy and stent placement.  She is agreed to have this done with my partner Dr. Richardo Hanks.   Vanna Scotland, MD  [facility] [address], [address] [place], [place] [address] [phone]

## 2021-04-28 ENCOUNTER — Ambulatory Visit: Payer: 59 | Admitting: Certified Registered"

## 2021-04-28 ENCOUNTER — Encounter
Admission: RE | Disposition: A | Discharge status: Home / Self Care | Payer: Self-pay | Source: Home / Self Care | Attending: Urology

## 2021-04-28 ENCOUNTER — Ambulatory Visit: Payer: 59

## 2021-04-28 ENCOUNTER — Encounter: Payer: Self-pay | Admitting: Urology

## 2021-04-28 ENCOUNTER — Ambulatory Visit: Payer: Self-pay | Admitting: Family Medicine

## 2021-04-28 ENCOUNTER — Other Ambulatory Visit: Payer: Self-pay

## 2021-04-28 ENCOUNTER — Ambulatory Visit
Admission: RE | Admit: 2021-04-28 | Discharge: 2021-04-28 | Disposition: A | Discharge status: Home / Self Care | Payer: 59 | Attending: Urology | Admitting: Urology

## 2021-04-28 DIAGNOSIS — N132 Hydronephrosis with renal and ureteral calculous obstruction: Secondary | ICD-10-CM | POA: Insufficient documentation

## 2021-04-28 DIAGNOSIS — Z8249 Family history of ischemic heart disease and other diseases of the circulatory system: Secondary | ICD-10-CM | POA: Insufficient documentation

## 2021-04-28 DIAGNOSIS — Z806 Family history of leukemia: Secondary | ICD-10-CM | POA: Diagnosis not present

## 2021-04-28 DIAGNOSIS — E039 Hypothyroidism, unspecified: Secondary | ICD-10-CM | POA: Insufficient documentation

## 2021-04-28 DIAGNOSIS — N200 Calculus of kidney: Secondary | ICD-10-CM

## 2021-04-28 DIAGNOSIS — Z833 Family history of diabetes mellitus: Secondary | ICD-10-CM | POA: Diagnosis not present

## 2021-04-28 DIAGNOSIS — N201 Calculus of ureter: Secondary | ICD-10-CM

## 2021-04-28 DIAGNOSIS — Z79899 Other long term (current) drug therapy: Secondary | ICD-10-CM | POA: Insufficient documentation

## 2021-04-28 DIAGNOSIS — Z8349 Family history of other endocrine, nutritional and metabolic diseases: Secondary | ICD-10-CM | POA: Diagnosis not present

## 2021-04-28 HISTORY — PX: CYSTOSCOPY/URETEROSCOPY/HOLMIUM LASER/STENT PLACEMENT: SHX6546

## 2021-04-28 LAB — POCT PREGNANCY, URINE: Preg Test, Ur: NEGATIVE

## 2021-04-28 SURGERY — CYSTOSCOPY/URETEROSCOPY/HOLMIUM LASER/STENT PLACEMENT
Anesthesia: Epidural | Laterality: Left

## 2021-04-28 MED ORDER — FENTANYL CITRATE (PF) 100 MCG/2ML IJ SOLN
INTRAMUSCULAR | Status: AC
  Filled 2021-04-28: qty 2

## 2021-04-28 MED ORDER — BELLADONNA ALKALOIDS-OPIUM 16.2-60 MG RE SUPP
RECTAL | Status: AC
  Filled 2021-04-28: qty 1

## 2021-04-28 MED ORDER — FENTANYL CITRATE (PF) 100 MCG/2ML IJ SOLN: 25.0000 ug | INTRAMUSCULAR | Status: DC | PRN

## 2021-04-28 MED ORDER — PHENYLEPHRINE HCL (PRESSORS) 10 MG/ML IV SOLN
INTRAVENOUS | Status: DC | PRN
  Administered 2021-04-28: 100 ug via INTRAVENOUS
  Administered 2021-04-28: 200 ug via INTRAVENOUS

## 2021-04-28 MED ORDER — FENTANYL CITRATE (PF) 100 MCG/2ML IJ SOLN
INTRAMUSCULAR | Status: DC | PRN
  Administered 2021-04-28: 50 ug via INTRAVENOUS

## 2021-04-28 MED ORDER — ONDANSETRON HCL 4 MG/2ML IJ SOLN
INTRAMUSCULAR | Status: DC | PRN
  Administered 2021-04-28 (×2): 4 mg via INTRAVENOUS

## 2021-04-28 MED ORDER — GLYCOPYRROLATE 0.2 MG/ML IJ SOLN
INTRAMUSCULAR | Status: DC | PRN
  Administered 2021-04-28: .2 mg via INTRAVENOUS

## 2021-04-28 MED ORDER — LACTATED RINGERS IV SOLN: INTRAVENOUS | Status: DC

## 2021-04-28 MED ORDER — ROCURONIUM BROMIDE 100 MG/10ML IV SOLN
INTRAVENOUS | Status: DC | PRN
  Administered 2021-04-28: 50 mg via INTRAVENOUS

## 2021-04-28 MED ORDER — SULFAMETHOXAZOLE-TRIMETHOPRIM 800-160 MG PO TABS: 1.0000 | ORAL_TABLET | Freq: Once | ORAL | 0 refills | Status: DC | PRN

## 2021-04-28 MED ORDER — CHLORHEXIDINE GLUCONATE 0.12 % MT SOLN: 15.0000 mL | Freq: Once | OROMUCOSAL | Status: AC

## 2021-04-28 MED ORDER — MIDAZOLAM HCL 2 MG/2ML IJ SOLN
INTRAMUSCULAR | Status: DC | PRN
  Administered 2021-04-28: 2 mg via INTRAVENOUS

## 2021-04-28 MED ORDER — ORAL CARE MOUTH RINSE: 15.0000 mL | Freq: Once | OROMUCOSAL | Status: AC

## 2021-04-28 MED ORDER — MIDAZOLAM HCL 2 MG/2ML IJ SOLN
INTRAMUSCULAR | Status: AC
  Filled 2021-04-28: qty 2

## 2021-04-28 MED ORDER — LIDOCAINE HCL (CARDIAC) PF 100 MG/5ML IV SOSY
PREFILLED_SYRINGE | INTRAVENOUS | Status: DC | PRN
  Administered 2021-04-28: 100 mg via INTRAVENOUS

## 2021-04-28 MED ORDER — DEXMEDETOMIDINE (PRECEDEX) IN NS 20 MCG/5ML (4 MCG/ML) IV SYRINGE
PREFILLED_SYRINGE | INTRAVENOUS | Status: DC | PRN
  Administered 2021-04-28: 8 ug via INTRAVENOUS

## 2021-04-28 MED ORDER — CEFAZOLIN SODIUM-DEXTROSE 2-4 GM/100ML-% IV SOLN
2.0000 g | INTRAVENOUS | Status: AC
  Administered 2021-04-28: 2 g via INTRAVENOUS

## 2021-04-28 MED ORDER — BELLADONNA ALKALOIDS-OPIUM 16.2-60 MG RE SUPP
RECTAL | Status: DC | PRN
  Administered 2021-04-28: 1 via RECTAL

## 2021-04-28 MED ORDER — PROMETHAZINE HCL 25 MG/ML IJ SOLN: 6.2500 mg | INTRAMUSCULAR | Status: DC | PRN

## 2021-04-28 MED ORDER — SUGAMMADEX SODIUM 500 MG/5ML IV SOLN
INTRAVENOUS | Status: DC | PRN
  Administered 2021-04-28: 500 mg via INTRAVENOUS

## 2021-04-28 MED ORDER — PROPOFOL 10 MG/ML IV BOLUS
INTRAVENOUS | Status: DC | PRN
  Administered 2021-04-28: 200 mg via INTRAVENOUS

## 2021-04-28 MED ORDER — ACETAMINOPHEN 10 MG/ML IV SOLN
INTRAVENOUS | Status: DC | PRN
  Administered 2021-04-28: 1000 mg via INTRAVENOUS

## 2021-04-28 MED ORDER — CEFAZOLIN SODIUM-DEXTROSE 2-4 GM/100ML-% IV SOLN
INTRAVENOUS | Status: AC
  Filled 2021-04-28: qty 100

## 2021-04-28 MED ORDER — SUCCINYLCHOLINE CHLORIDE 20 MG/ML IJ SOLN
INTRAMUSCULAR | Status: DC | PRN
  Administered 2021-04-28: 100 mg via INTRAVENOUS

## 2021-04-28 MED ORDER — CHLORHEXIDINE GLUCONATE 0.12 % MT SOLN
OROMUCOSAL | Status: AC
  Administered 2021-04-28: 15 mL via OROMUCOSAL
  Filled 2021-04-28: qty 15

## 2021-04-28 MED ORDER — PROPOFOL 10 MG/ML IV BOLUS
INTRAVENOUS | Status: AC
  Filled 2021-04-28: qty 20

## 2021-04-28 MED ORDER — IOHEXOL 180 MG/ML  SOLN
INTRAMUSCULAR | Status: DC | PRN
  Administered 2021-04-28: 5 mL

## 2021-04-28 MED ORDER — DEXAMETHASONE SODIUM PHOSPHATE 10 MG/ML IJ SOLN
INTRAMUSCULAR | Status: DC | PRN
  Administered 2021-04-28: 10 mg via INTRAVENOUS

## 2021-04-28 MED ORDER — ACETAMINOPHEN 10 MG/ML IV SOLN
INTRAVENOUS | Status: AC
  Filled 2021-04-28: qty 100

## 2021-04-28 SURGICAL SUPPLY — 29 items
BAG DRAIN CYSTO-URO LG1000N (MISCELLANEOUS) ×2 IMPLANT
BRUSH SCRUB EZ 1% IODOPHOR (MISCELLANEOUS) ×2 IMPLANT
CATH URET FLEX-TIP 2 LUMEN 10F (CATHETERS) IMPLANT
CATH URETL 5X70 OPEN END (CATHETERS) IMPLANT
CNTNR SPEC 2.5X3XGRAD LEK (MISCELLANEOUS)
CONT SPEC 4OZ STER OR WHT (MISCELLANEOUS)
CONT SPEC 4OZ STRL OR WHT (MISCELLANEOUS)
CONTAINER SPEC 2.5X3XGRAD LEK (MISCELLANEOUS) IMPLANT
DRAPE UTILITY 15X26 TOWEL STRL (DRAPES) ×2 IMPLANT
DRSG TEGADERM 2-3/8X2-3/4 SM (GAUZE/BANDAGES/DRESSINGS) ×2 IMPLANT
GLOVE SURG UNDER POLY LF SZ7.5 (GLOVE) ×2 IMPLANT
GOWN STRL REUS W/ TWL LRG LVL3 (GOWN DISPOSABLE) ×1 IMPLANT
GOWN STRL REUS W/ TWL XL LVL3 (GOWN DISPOSABLE) ×1 IMPLANT
GOWN STRL REUS W/TWL LRG LVL3 (GOWN DISPOSABLE) ×2
GOWN STRL REUS W/TWL XL LVL3 (GOWN DISPOSABLE) ×2
GUIDEWIRE STR DUAL SENSOR (WIRE) ×2 IMPLANT
INFUSOR MANOMETER BAG 3000ML (MISCELLANEOUS) ×2 IMPLANT
IV NS IRRIG 3000ML ARTHROMATIC (IV SOLUTION) ×2 IMPLANT
KIT TURNOVER CYSTO (KITS) ×2 IMPLANT
PACK CYSTO AR (MISCELLANEOUS) ×2 IMPLANT
SET CYSTO W/LG BORE CLAMP LF (SET/KITS/TRAYS/PACK) ×2 IMPLANT
SHEATH URETERAL 12FRX35CM (MISCELLANEOUS) IMPLANT
STENT URET 6FRX24 CONTOUR (STENTS) ×2 IMPLANT
STENT URET 6FRX26 CONTOUR (STENTS) IMPLANT
SURGILUBE 2OZ TUBE FLIPTOP (MISCELLANEOUS) ×2 IMPLANT
SYR 10ML LL (SYRINGE) ×2 IMPLANT
TRACTIP FLEXIVA PULSE ID 200 (Laser) ×2 IMPLANT
VALVE UROSEAL ADJ ENDO (VALVE) ×2 IMPLANT
WATER STERILE IRR 1000ML POUR (IV SOLUTION) ×2 IMPLANT

## 2021-04-28 NOTE — Anesthesia Procedure Notes (Signed)
Procedure Name: Intubation Date/Time: 04/28/2021 10:22 AM Performed by: Mohammed Kindle, CRNA Pre-anesthesia Checklist: Patient identified, Emergency Drugs available, Suction available and Patient being monitored Patient Re-evaluated:Patient Re-evaluated prior to induction Oxygen Delivery Method: Circle system utilized Preoxygenation: Pre-oxygenation with 100% oxygen Induction Type: IV induction Laryngoscope Size: McGraph and 3 Grade View: Grade I Tube type: Oral Tube size: 6.5 mm Number of attempts: 1 Airway Equipment and Method: Stylet Placement Confirmation: positive ETCO2, ETT inserted through vocal cords under direct vision, breath sounds checked- equal and bilateral and CO2 detector Secured at: 21 cm Tube secured with: Tape Dental Injury: Teeth and Oropharynx as per pre-operative assessment

## 2021-04-28 NOTE — Transfer of Care (Signed)
Immediate Anesthesia Transfer of Care Note  Patient: Shelly Coleman  Procedure(s) Performed: CYSTOSCOPY/URETEROSCOPY/HOLMIUM LASER/STENT PLACEMENT (Left)  Patient Location: PACU  Anesthesia Type:General  Level of Consciousness: awake, drowsy and patient cooperative  Airway & Oxygen Therapy: Patient Spontanous Breathing and Patient connected to face mask oxygen  Post-op Assessment: Report given to RN and Post -op Vital signs reviewed and stable  Post vital signs: Reviewed and stable  Last Vitals:  Vitals Value Taken Time  BP 129/89 04/28/21 1103  Temp    Pulse 79 04/28/21 1107  Resp 22 04/28/21 1107  SpO2 100 % 04/28/21 1107  Vitals shown include unvalidated device data.  Last Pain:  Vitals:   04/28/21 0939  TempSrc: Oral  PainSc: 2          Complications: No notable events documented.

## 2021-04-28 NOTE — Op Note (Signed)
Date of procedure: 04/28/21  Preoperative diagnosis:  Left proximal ureteral stone  Postoperative diagnosis:  Same  Procedure: Cystoscopy, left ureteroscopy, laser lithotripsy, left retrograde pyelogram with intraoperative interpretation, left ureteral stent placement  Surgeon: Legrand Rams, MD  Anesthesia: General  Complications: None  Intraoperative findings:  Normal cystoscopy, uncomplicated dusting of left proximal ureteral stone and stent placement with Dangler  EBL: None  Specimens: None  Drains: Left 6 French by 24 cm ureteral stent  Indication: Shelly Coleman is a 35 y.o. patient with 6 mm left proximal ureteral stone and poorly controlled renal colic.  After reviewing the management options for treatment, they elected to proceed with the above surgical procedure(s). We have discussed the potential benefits and risks of the procedure, side effects of the proposed treatment, the likelihood of the patient achieving the goals of the procedure, and any potential problems that might occur during the procedure or recuperation. Informed consent has been obtained.  Description of procedure:  The patient was taken to the operating room and general anesthesia was induced. SCDs were placed for DVT prophylaxis. The patient was placed in the dorsal lithotomy position, prepped and draped in the usual sterile fashion, and preoperative antibiotics(Ancef) were administered. A preoperative time-out was performed.   A 21 French cystoscope was used to intubate the urethra and thorough cystoscopy was performed.  The bladder was grossly normal.  A sensor wire passed easily into the left ureteral orifice advanced up to the kidney under fluoroscopic vision.  A semirigid ureteroscope was advanced into the distal ureter and there were no stones seen in the distal and mid ureter.  A second safety sensor wire was added up into the kidney.  A digital single channel flexible ureteroscope advanced easily  over the wire to the proximal ureter at the level of the stone.  Under direct vision there was a black calcium oxalate appearing stone that had been pushed back into the lower pole.  A 242 m laser fiber on settings of 0.3 J and 60 Hz was used to methodically dust the stone to <1 mm fragments.  Thorough pyeloscopy revealed no other stones.  A retrograde pyelogram from the proximal ureter showed no extravasation or filling defects.  Careful pullback ureteroscopy demonstrated no ureteral injury or fragments.  There was mild edema at the proximal ureter at the location of the stone had been lodged.  A 6 French by 24 cm ureteral stent with Dangler was placed uneventfully with an excellent curl in the renal pelvis, as well as in the bladder.  The bladder was drained and a belladonna suppository was placed.  Disposition: Stable to PACU  Plan: Remove stent at home on Tuesday morning 6/21 Follow-up in clinic in 6 to 8 weeks with 24-hour urine metabolic work-up  Legrand Rams, MD

## 2021-04-28 NOTE — Anesthesia Postprocedure Evaluation (Signed)
Anesthesia Post Note  Patient: Shelly Coleman  Procedure(s) Performed: CYSTOSCOPY/URETEROSCOPY/HOLMIUM LASER/STENT PLACEMENT (Left)  Patient location during evaluation: PACU Anesthesia Type: Epidural Level of consciousness: awake and alert Pain management: pain level controlled Vital Signs Assessment: post-procedure vital signs reviewed and stable Respiratory status: spontaneous breathing, nonlabored ventilation, respiratory function stable and patient connected to nasal cannula oxygen Cardiovascular status: blood pressure returned to baseline and stable Postop Assessment: no apparent nausea or vomiting Anesthetic complications: no   No notable events documented.   Last Vitals:  Vitals:   04/28/21 1145 04/28/21 1155  BP: 115/84 114/78  Pulse: 62 (!) 58  Resp: 14 16  Temp: (!) 36.3 C (!) 36.3 C  SpO2: 98% 100%    Last Pain:  Vitals:   04/28/21 1155  TempSrc: Temporal  PainSc: 0-No pain                 Lenard Simmer

## 2021-04-28 NOTE — Discharge Instructions (Signed)

## 2021-04-28 NOTE — Anesthesia Preprocedure Evaluation (Addendum)
Anesthesia Evaluation  Patient identified by MRN, date of birth, ID band Patient awake    Reviewed: Allergy & Precautions, H&P , NPO status , Patient's Chart, lab work & pertinent test results  History of Anesthesia Complications Negative for: history of anesthetic complications  Airway Mallampati: II  TM Distance: >3 FB Neck ROM: full    Dental  (+) Chipped, Dental Advidsory Given   Pulmonary neg pulmonary ROS,           Cardiovascular Exercise Tolerance: Good (-) hypertension(-) anginanegative cardio ROS       Neuro/Psych  Headaches, neg Seizures PSYCHIATRIC DISORDERS Anxiety    GI/Hepatic negative GI ROS,   Endo/Other  neg diabetesHypothyroidism   Renal/GU Renal disease (kidney stones)  negative genitourinary   Musculoskeletal   Abdominal   Peds  Hematology negative hematology ROS (+)   Anesthesia Other Findings Past Medical History: No date: Obesity (BMI 35.0-39.9 without comorbidity) No date: Thyroid disease No date: Vegetarian diet  Past Surgical History: No date: NO PAST SURGERIES  BMI    Body Mass Index:  39.48 kg/m      Reproductive/Obstetrics negative OB ROS                            Anesthesia Physical  Anesthesia Plan  ASA: 2  Anesthesia Plan: Epidural and General   Post-op Pain Management:    Induction: Intravenous  PONV Risk Score and Plan: 3 and Ondansetron, Dexamethasone, Midazolam and Treatment may vary due to age or medical condition  Airway Management Planned: Oral ETT  Additional Equipment:   Intra-op Plan:   Post-operative Plan: Extubation in OR  Informed Consent: I have reviewed the patients History and Physical, chart, labs and discussed the procedure including the risks, benefits and alternatives for the proposed anesthesia with the patient or authorized representative who has indicated his/her understanding and acceptance.       Plan  Discussed with: Anesthesiologist  Anesthesia Plan Comments:        Anesthesia Quick Evaluation

## 2021-04-28 NOTE — Interval H&P Note (Signed)
UROLOGY H&P UPDATE  Agree with prior H&P dated 04/27/21 by Dr. Apolinar Junes.  Cardiac: RRR Lungs: CTA bilaterally  Laterality: LEFT Procedure: Left ureteroscopy, laser lithotripsy, stent placement  Urine: UA benign  We specifically discussed the risks ureteroscopy including bleeding, infection/sepsis, stent related symptoms including flank pain/urgency/frequency/incontinence/dysuria, ureteral injury, inability to access stone, or need for staged or additional procedures.   Sondra Come, MD 04/28/2021

## 2021-04-29 ENCOUNTER — Telehealth: Payer: Self-pay

## 2021-04-29 LAB — URINALYSIS, COMPLETE
Bilirubin, UA: NEGATIVE
Glucose, UA: NEGATIVE
Ketones, UA: NEGATIVE
Nitrite, UA: NEGATIVE
Protein,UA: NEGATIVE
Specific Gravity, UA: 1.01 (ref 1.005–1.030)
Urobilinogen, Ur: 0.2 mg/dL (ref 0.2–1.0)
pH, UA: 5.5 (ref 5.0–7.5)

## 2021-04-29 LAB — MICROSCOPIC EXAMINATION

## 2021-04-29 LAB — URINE CULTURE

## 2021-04-29 NOTE — Telephone Encounter (Signed)
Patient notified and scheduled appt. [employer] form was filled out and faxed. Instructions reviewed with patient and sent via mychart as well

## 2021-04-29 NOTE — Telephone Encounter (Signed)
-----   Message from Sondra Come, MD sent at 04/28/2021 11:18 AM EDT ----- Regarding: 24 hr urine Please set up a clinic visit in 6 weeks with a 24-hour urine test prior, thanks  Legrand Rams, MD 04/28/2021

## 2021-04-30 ENCOUNTER — Emergency Department
Admission: EM | Admit: 2021-04-30 | Discharge: 2021-04-30 | Disposition: A | Discharge status: Home / Self Care | Payer: 59 | Attending: Emergency Medicine | Admitting: Emergency Medicine

## 2021-04-30 ENCOUNTER — Other Ambulatory Visit: Payer: Self-pay

## 2021-04-30 ENCOUNTER — Emergency Department: Payer: 59

## 2021-04-30 ENCOUNTER — Encounter: Payer: Self-pay | Admitting: Emergency Medicine

## 2021-04-30 DIAGNOSIS — R32 Unspecified urinary incontinence: Secondary | ICD-10-CM | POA: Insufficient documentation

## 2021-04-30 DIAGNOSIS — E039 Hypothyroidism, unspecified: Secondary | ICD-10-CM | POA: Insufficient documentation

## 2021-04-30 DIAGNOSIS — T83122A Displacement of urinary stent, initial encounter: Secondary | ICD-10-CM | POA: Diagnosis present

## 2021-04-30 DIAGNOSIS — Z79899 Other long term (current) drug therapy: Secondary | ICD-10-CM | POA: Insufficient documentation

## 2021-04-30 DIAGNOSIS — Y732 Prosthetic and other implants, materials and accessory gastroenterology and urology devices associated with adverse incidents: Secondary | ICD-10-CM | POA: Insufficient documentation

## 2021-04-30 LAB — URINALYSIS, COMPLETE (UACMP) WITH MICROSCOPIC
Bilirubin Urine: NEGATIVE
Glucose, UA: NEGATIVE mg/dL
Ketones, ur: NEGATIVE mg/dL
Nitrite: NEGATIVE
Protein, ur: 30 mg/dL — AB
RBC / HPF: >50 RBC/hpf — ABNORMAL HIGH (ref 0–5)
Specific Gravity, Urine: 1.003 — ABNORMAL LOW (ref 1.005–1.030)
Squamous Epithelial / HPF: NONE SEEN (ref 0–5)
pH: 6 (ref 5.0–8.0)

## 2021-04-30 MED ORDER — KETOROLAC TROMETHAMINE 30 MG/ML IJ SOLN
30.0000 mg | Freq: Once | INTRAMUSCULAR | Status: AC
  Administered 2021-04-30: 30 mg via INTRAMUSCULAR
  Filled 2021-04-30: qty 1

## 2021-04-30 MED ORDER — SULFAMETHOXAZOLE-TRIMETHOPRIM 800-160 MG PO TABS
1.0000 | ORAL_TABLET | Freq: Once | ORAL | Status: AC
  Administered 2021-04-30: 1 via ORAL
  Filled 2021-04-30: qty 1

## 2021-04-30 NOTE — ED Notes (Signed)
Pt to ED via POV, states is 2 days post up from cystoscopy, today was was wiping after urinating and accidentally pulled on thread and is now constantly leaking urine. Pt denies pain, A&O x4, ambulatory to triage desk.

## 2021-04-30 NOTE — ED Provider Notes (Signed)
[facility] Emergency Department Provider Note   ____________________________________________    I have reviewed the triage vital signs and the nursing notes.   HISTORY  Chief Complaint Post-op Problem     HPI Shelly Coleman is a 35 y.o. female who presents with complaints of urinary incontinence.  Patient had recent ureteral stent placement for 6 mm proximal kidney stone 2 days ago.  Had urinated this morning and was wiping afterwards when she accidentally pulled the string and thinks she may have pulled the stent down.  Nearly an hour after this occurred she started having urinary leakage.  She is not having any pain.  Past Medical History:  Diagnosis Date   Migraine headache with aura    Obesity (BMI 35.0-39.9 without comorbidity)    Thyroid disease    hypothyroidism   Vegetarian diet     Patient Active Problem List   Diagnosis Date Noted   Left lower quadrant abdominal pain 04/26/2021   Prediabetes 09/06/2020   Current moderate episode of major depressive disorder without prior episode (HCC) 09/06/2020   GAD (generalized anxiety disorder) 09/06/2020   Vitamin B12 deficiency 08/15/2019   History of anemia 08/15/2019   Family history of diabetes mellitus 08/15/2019   Hypothyroidism due to Hashimoto's thyroiditis 01/31/2019   Migraine headache with aura    S/P cesarean section 03/20/2018   Vegetarian diet 10/23/2017   Vitamin D deficiency 08/14/2017   Migraine with aura and without status migrainosus, not intractable 08/10/2017   Obesity (BMI 30.0-34.9) 08/10/2017   Goiter diffuse 11/02/2015   Dyspareunia, female 11/02/2015    Past Surgical History:  Procedure Laterality Date   CESAREAN SECTION N/A 03/20/2018   Procedure: CESAREAN SECTION- STAT;  Surgeon: Natale Milch, MD;  Location: [facility] ORS;  Service: Obstetrics;  Laterality: N/A;   CYSTOSCOPY/URETEROSCOPY/HOLMIUM LASER/STENT PLACEMENT Left 04/28/2021   Procedure:  CYSTOSCOPY/URETEROSCOPY/HOLMIUM LASER/STENT PLACEMENT;  Surgeon: Sondra Come, MD;  Location: [facility] ORS;  Service: Urology;  Laterality: Left;    Prior to Admission medications   Medication Sig Start Date End Date Taking? Authorizing Provider  baclofen (LIORESAL) 10 MG tablet Take 10 mg by mouth every 4 (four) hours as needed for muscle spasms. 04/25/21   [provider]  Cholecalciferol (VITAMIN D3) 5000 units CAPS Take 5,000 Units by mouth 2 (two) times a week.    [provider]  HYDROcodone-acetaminophen (NORCO) 10-325 MG tablet Take 1 tablet by mouth every 6 (six) hours as needed for up to 5 days. 04/27/21 05/02/21  McElwee, Lauren A, NP  ibuprofen (ADVIL) 800 MG tablet Take 800 mg by mouth 3 (three) times daily as needed for moderate pain. 04/25/21   [provider]  levothyroxine (SYNTHROID) 50 MCG tablet TAKE 1 TABLET BY MOUTH  DAILY Patient taking differently: Take 50 mcg by mouth daily before breakfast. 12/04/20   Bacigalupo, Marzella Schlein, MD  Multiple Vitamin (MULTIVITAMIN WITH MINERALS) TABS tablet Take 1 tablet by mouth 2 (two) times a week.    [provider]  naproxen sodium (ALEVE) 220 MG tablet Take 440 mg by mouth daily as needed (headaches).    [provider]  ondansetron (ZOFRAN-ODT) 4 MG disintegrating tablet Take 4 mg by mouth every 8 (eight) hours as needed for nausea. 04/25/21   [provider]  Semaglutide, 1 MG/DOSE, (OZEMPIC, 1 MG/DOSE,) 2 MG/1.5ML SOPN Inject 1 mg into the skin once a week. 12/27/20   Erasmo Downer, MD  sulfamethoxazole-trimethoprim (BACTRIM DS) 800-160 MG tablet Take  1 tablet by mouth once as needed for up to 1 dose (take 30 minutes prior to stent removal). 04/28/21   Sondra Come, MD  tamsulosin (FLOMAX) 0.4 MG CAPS capsule Take 1 capsule (0.4 mg total) by mouth daily. 04/26/21   McElwee, Jake Church, NP     Allergies Patient has no known allergies.  Family History  Problem Relation Age of Onset    Leukemia Maternal Grandfather    Hypertension Father 31   Diabetes Father 45   Diabetes Paternal Grandmother 85   Thyroid disease Paternal Grandmother    Hypertension Paternal Grandfather 35   Diabetes Paternal Grandfather 9   Thyroid disease Mother    Healthy Brother    Healthy Son    Breast cancer Neg Hx    Colon cancer Neg Hx     Social History Social History   Tobacco Use   Smoking status: Never   Smokeless tobacco: Never  Vaping Use   Vaping Use: Never used  Substance Use Topics   Alcohol use: Yes    Comment: rare   Drug use: No    Review of Systems  Constitutional: No fever/chills Eyes: No visual changes.  ENT: No sore throat. Cardiovascular: Denies chest pain. Respiratory: Denies shortness of breath. Gastrointestinal: As above Genitourinary: Negative for dysuria.  As above Musculoskeletal: Negative for back pain. Skin: Negative for rash. Neurological: Negative for headaches or weakness   ____________________________________________   PHYSICAL EXAM:  VITAL SIGNS: ED Triage Vitals  Enc Vitals Group     BP 04/30/21 1308 (!) 133/95     Pulse Rate 04/30/21 1308 80     Resp 04/30/21 1308 18     Temp 04/30/21 1308 98.3 F (36.8 C)     Temp Source 04/30/21 1308 Oral     SpO2 04/30/21 1308 100 %     Weight 04/30/21 1300 88.9 kg (196 lb)     Height 04/30/21 1300 1.626 m (5\' 4" )     Head Circumference --      Peak Flow --      Pain Score 04/30/21 1300 0     Pain Loc --      Pain Edu? --      Excl. in GC? --     Constitutional: Alert and oriented.   Mouth/Throat: Mucous membranes are moist.   Neck:  Painless ROM Cardiovascular: Normal rate, regular rhythm. sounds.  Good peripheral circulation. Respiratory: Normal respiratory effort.    Gastrointestinal: Soft and nontender. No distention.  No CVA tenderness. Genitourinary: Unable to visualize distal portion of the ureteral just barely within the urethra Musculoskeletal: Warm and well  perfused Neurologic:  Normal speech and language. No gross focal neurologic deficits are appreciated.  Skin:  Skin is warm, dry and intact. No rash noted. Psychiatric: Mood and affect are normal. Speech and behavior are normal.  ____________________________________________   LABS (all labs ordered are listed, but only abnormal results are displayed)  Labs Reviewed  URINALYSIS, COMPLETE (UACMP) WITH MICROSCOPIC - Abnormal; Notable for the following components:      Result Value   Color, Urine YELLOW (*)    APPearance CLEAR (*)    Specific Gravity, Urine 1.003 (*)    Hgb urine dipstick LARGE (*)    Protein, ur 30 (*)    Leukocytes,Ua MODERATE (*)    RBC / HPF >50 (*)    Bacteria, UA FEW (*)    All other components within normal limits   ____________________________________________  EKG  None  ____________________________________________  RADIOLOGY  KUB reviewed by me, stent appears displaced ____________________________________________   PROCEDURES  Procedure(s) performed: No  Procedures   Critical Care performed: No ____________________________________________   INITIAL IMPRESSION / ASSESSMENT AND PLAN / ED COURSE  Pertinent labs & imaging results that were available during my care of the patient were reviewed by me and considered in my medical decision making (see chart for details).   Patient presents with above complaint.  KUB inferiorly displaced causing urinary leakage.  Discussed with Dr. Annabell Howells of urology who recommends IM Toradol and removal at this time  ----------------------------------------- 3:17 PM on 04/30/2021 ----------------------------------------- Stent removed without difficulty, patient tolerated well.  No pain.  Husband is fairly insistent on repeat KUB to see if stone is still there.      ____________________________________________   FINAL CLINICAL IMPRESSION(S) / ED DIAGNOSES  Final diagnoses:  Ureteral stent displacement,  initial encounter [facility])        Note:  This document was prepared using Dragon voice recognition software and may include unintentional dictation errors.    Jene Every, MD 04/30/21 [phone]

## 2021-04-30 NOTE — ED Notes (Signed)
Patient given pad and brief to change into due to drainage soaking her pants

## 2021-05-26 ENCOUNTER — Telehealth: Payer: Self-pay

## 2021-05-26 NOTE — Telephone Encounter (Signed)
Copied from CRM [id number]. Topic: Appointment Scheduling - Scheduling Inquiry for Clinic >> May 26, 2021 10:01 AM Shelly Coleman D wrote: Reason for CRM: Pt called asking to make an appt for a CPE in Sept.  Her last PE was in Oct of last year but she says she has changed ins.  Please advise  CB#  [phone]

## 2021-05-27 NOTE — Telephone Encounter (Signed)
Called pt and advised her that she would still need to check with insurance to make sure it will be covered if done early.  She agreed and will call back to schedule.  There is no need to send a message because [facility] should be able to schedule Dr. Senaida Lange cpe appointments.

## 2021-05-31 ENCOUNTER — Other Ambulatory Visit: Payer: Self-pay

## 2021-05-31 ENCOUNTER — Other Ambulatory Visit: Payer: 59

## 2021-06-06 ENCOUNTER — Other Ambulatory Visit: Payer: Self-pay | Admitting: Urology

## 2021-06-09 ENCOUNTER — Ambulatory Visit: Payer: Self-pay | Admitting: Urology

## 2021-06-16 ENCOUNTER — Encounter: Payer: Self-pay | Admitting: Urology

## 2021-06-16 ENCOUNTER — Ambulatory Visit (INDEPENDENT_AMBULATORY_CARE_PROVIDER_SITE_OTHER): Payer: 59 | Admitting: Urology

## 2021-06-16 ENCOUNTER — Other Ambulatory Visit: Payer: Self-pay

## 2021-06-16 VITALS — BP 132/90 | HR 77 | Ht 64.0 in | Wt 200.0 lb

## 2021-06-16 DIAGNOSIS — N200 Calculus of kidney: Secondary | ICD-10-CM

## 2021-06-16 NOTE — Patient Instructions (Addendum)
Based on your 24-hour urine results, the most important things to prevent further kidney stones are continuing to drink plenty of fluids with a goal of 2.5 L of urine output per day, increase citrate in the diet which can prevent stones(high sources of citrate are lemon juice, grapefruit juice, and Crystal light lemonade, as well as many fruits and vegetables), and avoiding high salt foods as these can bring extra calcium into the urine and form stones.  Dietary Guidelines to Help Prevent Kidney Stones Kidney stones are deposits of minerals and salts that form inside your kidneys. Your risk of developing kidney stones may be greater depending on your diet, your lifestyle, the medicines you take, and whether you have certain medical conditions. Most people can lower their chances of developing kidney stones by following the instructions below. Your dietitian may give you more specific instructions depending on your overall health and the type of kidney stones youtend to develop. What are tips for following this plan? Reading food labels  Choose foods with "no salt added" or "low-salt" labels. Limit your salt (sodium) intake to less than 1,500 mg a day. Choose foods with calcium for each meal and snack. Try to eat about 300 mg of calcium at each meal. Foods that contain 200-500 mg of calcium a serving include: 8 oz (237 mL) of milk, calcium-fortifiednon-dairy milk, and calcium-fortifiedfruit juice. Calcium-fortified means that calcium has been added to these drinks. 8 oz (237 mL) of kefir, yogurt, and soy yogurt. 4 oz (114 g) of tofu. 1 oz (28 g) of cheese. 1 cup (150 g) of dried figs. 1 cup (91 g) of cooked broccoli. One 3 oz (85 g) can of sardines or mackerel. Most people need 1,000-1,500 mg of calcium a day. Talk to your dietitian abouthow much calcium is recommended for you. Shopping Buy plenty of fresh fruits and vegetables. Most people do not need to avoid fruits and vegetables, even if these  foods contain nutrients that may contribute to kidney stones. When shopping for convenience foods, choose: Whole pieces of fruit. Pre-made salads with dressing on the side. Low-fat fruit and yogurt smoothies. Avoid buying frozen meals or prepared deli foods. These can be high in sodium. Look for foods with live cultures, such as yogurt and kefir. Choose high-fiber grains, such as whole-wheat breads, oat bran, and wheat cereals. Cooking Do not add salt to food when cooking. Place a salt shaker on the table and allow each person to add his or her own salt to taste. Use vegetable protein, such as beans, textured vegetable protein (TVP), or tofu, instead of meat in pasta, casseroles, and soups. Meal planning Eat less salt, if told by your dietitian. To do this: Avoid eating processed or pre-made food. Avoid eating fast food. Eat less animal protein, including cheese, meat, poultry, or fish, if told by your dietitian. To do this: Limit the number of times you have meat, poultry, fish, or cheese each week. Eat a diet free of meat at least 2 days a week. Eat only one serving each day of meat, poultry, fish, or seafood. When you prepare animal protein, cut pieces into small portion sizes. For most meat and fish, one serving is about the size of the palm of your hand. Eat at least five servings of fresh fruits and vegetables each day. To do this: Keep fruits and vegetables on hand for snacks. Eat one piece of fruit or a handful of berries with breakfast. Have a salad and fruit at lunch. Have  two kinds of vegetables at dinner. Limit foods that are high in a substance called oxalate. These include: Spinach (cooked), rhubarb, beets, sweet potatoes, and Swiss chard. Peanuts. Potato chips, french fries, and baked potatoes with skin on. Nuts and nut products. Chocolate. If you regularly take a diuretic medicine, make sure to eat at least 1 or 2 servings of fruits or vegetables that are high in  potassium each day. These include: Avocado. Banana. Orange, prune, carrot, or tomato juice. Baked potato. Cabbage. Beans and split peas. Lifestyle  Drink enough fluid to keep your urine pale yellow. This is the most important thing you can do. Spread your fluid intake throughout the day. If you drink alcohol: Limit how much you use to: 0-1 drink a day for women who are not pregnant. 0-2 drinks a day for men. Be aware of how much alcohol is in your drink. In the [place] one drink equals one 12 oz bottle of beer (355 mL), one 5 oz glass of wine (148 mL), or one 1 oz glass of hard liquor (44 mL). Lose weight if told by your health care provider. Work with your dietitian to find an eating plan and weight loss strategies that work best for you.  General information Talk to your health care provider and dietitian about taking daily supplements. You may be told the following depending on your health and the cause of your kidney stones: Not to take supplements with vitamin C. To take a calcium supplement. To take a daily probiotic supplement. To take other supplements such as magnesium, fish oil, or vitamin B6. Take over-the-counter and prescription medicines only as told by your health care provider. These include supplements. What foods should I limit? Limit your intake of the following foods, or eat them as told by your dietitian. Vegetables Spinach. Rhubarb. Beets. Canned vegetables. Rosita Fire. Olives. Baked potatoeswith skin. Grains Wheat bran. Baked goods. Salted crackers. Cereals high in sugar. Meats and other proteins Nuts. Nut butters. Large portions of meat, poultry, or fish. Salted, precooked,or cured meats, such as sausages, meat loaves, and hot dogs. Dairy Cheese. Beverages Regular soft drinks. Regular vegetable juice. Seasonings and condiments Seasoning blends with salt. Salad dressings. Soy sauce. Ketchup. Barbecue sauce. Other foods Canned soups. Canned pasta sauce.  Casseroles. Pizza. Lasagna. Frozen meals.Potato chips. [place] fries. The items listed above may not be a complete list of foods and beverages you should limit. Contact a dietitian for more information. What foods should I avoid? Talk to your dietitian about specific foods you should avoid based on the typeof kidney stones you have and your overall health. Fruits Grapefruit. The item listed above may not be a complete list of foods and beverages you should avoid. Contact a dietitian for more information. Summary Kidney stones are deposits of minerals and salts that form inside your kidneys. You can lower your risk of kidney stones by making changes to your diet. The most important thing you can do is drink enough fluid. Drink enough fluid to keep your urine pale yellow. Talk to your dietitian about how much calcium you should have each day, and eat less salt and animal protein as told by your dietitian. This information is not intended to replace advice given to you by your health care provider. Make sure you discuss any questions you have with your healthcare provider. Document Revised: 10/23/2019 Document Reviewed: 10/23/2019 Elsevier Patient Education  2022 [employer].

## 2021-06-16 NOTE — Progress Notes (Signed)
   06/16/2021 3:19 PM   Shelly Coleman 1986-05-09 [id number]  Reason for visit: Follow up 24-hour urine results, nephrolithiasis  HPI: 35 year old female who underwent left ureteroscopy, laser lithotripsy, stent placement in June 2022 for a 6 mm left ureteral stone, likely calcium oxalate.  Her stent was partially removed early and she was seen in the ED a few days later.  I personally viewed and interpreted the KUB that shows no residual stone fragments, but the stent displaced into the urethra.  Stent was removed at that time.  She has done well since then.  I reviewed her 24-hour urine results today that were notable for a good urine volume of 2.27 L, urine calcium of 88, urine oxalate of 23, mildly low urine citrate of 487, elevated urine sodium of 196.  We discussed general stone prevention strategies including adequate hydration with goal of producing 2.5 L of urine daily, increasing citric acid intake, increasing calcium intake during high oxalate meals, minimizing animal protein, and decreasing salt intake. Information about dietary recommendations given today.   Encouraged to increase citrate in the diet, continue high-volume fluid intake, and low-salt diet Follow-up as needed  Sondra Come, MD  [facility] [address], [address] [place], [place] [address] [phone]

## 2021-06-20 ENCOUNTER — Telehealth: Payer: Self-pay

## 2021-06-20 NOTE — Telephone Encounter (Signed)
Copied from CRM [id number]. Topic: Appointment Scheduling - Scheduling Inquiry for Clinic >> Jun 20, 2021  3:06 PM Crist Infante wrote: Reason for CRM: pt needs CPE after 10/25, pt states she wants with Dr B and Dr B only. Hopes for call baxk asap

## 2021-06-21 NOTE — Telephone Encounter (Signed)
Appt made for 11/15/2021

## 2021-07-22 ENCOUNTER — Other Ambulatory Visit: Payer: Self-pay | Admitting: Nurse Practitioner

## 2021-07-22 NOTE — Telephone Encounter (Signed)
Requested medication (s) are due for refill today:   No  Requested medication (s) are on the active medication list:   No  Future visit scheduled:   Yes   Last ordered: 06/16/2021 discontinued by Dr. Legrand Rams with [facility]   Returned because request came in for refill from pharmacy so returned to PCP per protocol for review.   Requested Prescriptions  Pending Prescriptions Disp Refills   tamsulosin (FLOMAX) 0.4 MG CAPS capsule [Pharmacy Med Name: TAMSULOSIN HCL 0.4 MG CAPSULE] 90 capsule 1    Sig: TAKE 1 CAPSULE BY MOUTH EVERY DAY     Urology: Alpha-Adrenergic Blocker Failed - 07/22/2021  3:03 AM      Failed - Last BP in normal range    BP Readings from Last 1 Encounters:  06/16/21 132/90          Passed - Valid encounter within last 12 months    Recent Outpatient Visits           2 months ago Left lower quadrant abdominal pain   [facility] McElwee, Lauren A, NP   6 months ago Obesity (BMI 30.0-34.9)   [facility] [place], Marzella Schlein, MD   10 months ago Obesity (BMI 30.0-34.9)   [facility] Bacigalupo, Marzella Schlein, MD   10 months ago Encounter for annual physical exam   [facility] [place], Marzella Schlein, MD   1 year ago Encounter for annual physical exam   [facility], Marzella Schlein, MD       Future Appointments             In 3 months Bacigalupo, Marzella Schlein, MD [facility], [facility]

## 2021-08-29 ENCOUNTER — Other Ambulatory Visit: Payer: Self-pay | Admitting: Family Medicine

## 2021-08-30 MED ORDER — LEVOTHYROXINE SODIUM 50 MCG PO TABS: 50.0000 ug | ORAL_TABLET | Freq: Every day | ORAL | 1 refills | Status: DC

## 2021-08-31 ENCOUNTER — Telehealth: Payer: Self-pay | Admitting: Family Medicine

## 2021-08-31 NOTE — Telephone Encounter (Signed)
Tresa Endo from Cover My Meds called requesting more information regarding patient's Ozempic.  Reference Key: [facility]  Best contact: [phone]

## 2021-09-02 NOTE — Telephone Encounter (Signed)
Per Cover my meds patient's insurance company does not have correct information. Patient advised to call and verify information then call us back.

## 2021-09-02 NOTE — Telephone Encounter (Signed)
Called [facility]. 

## 2021-11-15 ENCOUNTER — Encounter: Payer: 59 | Admitting: Family Medicine

## 2021-11-23 NOTE — Progress Notes (Signed)
Complete physical exam   Patient: Shelly Coleman   DOB: [birth year]   36 y.o. Female  MRN: [id number] Visit Date: 11/24/2021  Today's healthcare provider: Lavon Paganini, MD   Chief Complaint  Patient presents with   Annual Exam   Subjective    Shelly Coleman is a 36 y.o. female who presents today for a complete physical exam.  She reports consuming a  vegetarian  diet. The patient does not participate in regular exercise at present. She generally feels well. She reports sleeping fairly well. She does not have additional problems to discuss today.  HPI  11/02/15 Pap-WNL- Patient wants to have her pap done next year. Declines now.  She is seeing a therapist for anxiety and depression. Doing brainspotting and that worked well. Declines any thoughts of wanting to hurt herself. Feels safe to self.   Past Medical History:  Diagnosis Date   Migraine headache with aura    Obesity (BMI 35.0-39.9 without comorbidity)    Thyroid disease    hypothyroidism   Vegetarian diet    Past Surgical History:  Procedure Laterality Date   CESAREAN SECTION N/A 03/20/2018   Procedure: CESAREAN SECTION- STAT;  Surgeon: Homero Fellers, MD;  Location: [facility] ORS;  Service: Obstetrics;  Laterality: N/A;   CYSTOSCOPY/URETEROSCOPY/HOLMIUM LASER/STENT PLACEMENT Left 04/28/2021   Procedure: CYSTOSCOPY/URETEROSCOPY/HOLMIUM LASER/STENT PLACEMENT;  Surgeon: Billey Co, MD;  Location: [facility] ORS;  Service: Urology;  Laterality: Left;   Social History   Socioeconomic History   Marital status: Married    Spouse name: Binay   Number of children: 1   Years of education: 18   Highest education level: Not on file  Occupational History   Occupation: mortgage     Comment: [employer]  Tobacco Use   Smoking status: Never   Smokeless tobacco: Never  Vaping Use   Vaping Use: Never used  Substance and Sexual Activity   Alcohol use: Yes    Comment: rare   Drug use: No   Sexual activity: Yes     Partners: Male    Birth control/protection: Condom  Other Topics Concern   Not on file  Social History Narrative   Not on file   Social Determinants of Health   Financial Resource Strain: Not on file  Food Insecurity: Not on file  Transportation Needs: Not on file  Physical Activity: Not on file  Stress: Not on file  Social Connections: Not on file  Intimate Partner Violence: Not on file   Family Status  Relation Name Status   MGF  Deceased at age 2   Father  Alive   [place]   PGF  Deceased   Mother  Alive   Brother  Alive   Son  Alive   MGM  Deceased   Neg Hx  (Not Specified)   Family History  Problem Relation Age of Onset   Leukemia Maternal Grandfather    Hypertension Father 65   Diabetes Father 42   Diabetes Paternal Grandmother 42   Thyroid disease Paternal Grandmother    Hypertension Paternal Grandfather 60   Diabetes Paternal Grandfather 59   Thyroid disease Mother    Healthy Brother    Healthy Son    Breast cancer Neg Hx    Colon cancer Neg Hx    No Known Allergies  Patient Care Team: Chaynce Schafer, Dionne Bucy, MD as PCP - General (Family Medicine)   Medications: Outpatient Medications Prior to Visit  Medication Sig  levothyroxine (SYNTHROID) 50 MCG tablet Take 1 tablet (50 mcg total) by mouth daily.   Multiple Vitamin (MULTIVITAMIN WITH MINERALS) TABS tablet Take 1 tablet by mouth 2 (two) times a week.   OZEMPIC, 1 MG/DOSE, 4 MG/3ML SOPN INJECT 1 MG INTO THE SKIN ONCE A WEEK.   No facility-administered medications prior to visit.    Review of Systems  Constitutional:  Positive for activity change, appetite change and fatigue.       Irritability  HENT: Negative.    Eyes: Negative.   Respiratory: Negative.    Cardiovascular: Negative.   Gastrointestinal: Negative.   Endocrine: Negative.   Genitourinary: Negative.   Musculoskeletal: Negative.   Skin: Negative.   Allergic/Immunologic: Negative.   Neurological: Negative.   Hematological:  Negative.   Psychiatric/Behavioral:  Positive for decreased concentration. The patient is nervous/anxious.    Last CBC Lab Results  Component Value Date   WBC 8.2 04/26/2021   HGB 12.9 04/26/2021   HCT 39.5 04/26/2021   MCV 82 04/26/2021   MCH 26.7 04/26/2021   RDW 13.2 04/26/2021   PLT 331 50/93/2671   Last metabolic panel Lab Results  Component Value Date   GLUCOSE 96 04/26/2021   NA 140 04/26/2021   K 4.2 04/26/2021   CL 103 04/26/2021   CO2 20 04/26/2021   BUN 9 04/26/2021   CREATININE 0.68 04/26/2021   EGFR 116 04/26/2021   CALCIUM 9.5 04/26/2021   PROT 7.2 04/26/2021   ALBUMIN 4.5 04/26/2021   LABGLOB 2.7 04/26/2021   AGRATIO 1.7 04/26/2021   BILITOT 0.3 04/26/2021   ALKPHOS 53 04/26/2021   AST 8 04/26/2021   ALT 14 04/26/2021   ANIONGAP 10 03/20/2018   Last lipids Lab Results  Component Value Date   CHOL 224 (H) 09/08/2020   HDL 48 09/08/2020   LDLCALC 153 (H) 09/08/2020   TRIG 127 09/08/2020   CHOLHDL 4.7 (H) 09/08/2020   Last hemoglobin A1c Lab Results  Component Value Date   HGBA1C 6.0 (H) 09/08/2020   Last thyroid functions Lab Results  Component Value Date   TSH 1.410 09/08/2020   Last vitamin D Lab Results  Component Value Date   VD25OH 20.6 (L) 09/08/2020   Last vitamin B12 and Folate Lab Results  Component Value Date   VITAMINB12 >2000 (H) 09/08/2020      Objective    BP 110/79 (BP Location: Right Arm, Patient Position: Sitting, Cuff Size: Large)    Pulse 81    Resp 16    Ht '5\' 4"'  (1.626 m)    Wt 205 lb 9.6 oz (93.3 kg)    BMI 35.29 kg/m  BP Readings from Last 3 Encounters:  11/24/21 110/79  06/16/21 132/90  04/30/21 (!) 133/95   Wt Readings from Last 3 Encounters:  11/24/21 205 lb 9.6 oz (93.3 kg)  06/16/21 200 lb (90.7 kg)  04/30/21 196 lb (88.9 kg)      Physical Exam Vitals reviewed.  Constitutional:      General: She is not in acute distress.    Appearance: Normal appearance. She is well-developed. She is not  diaphoretic.  HENT:     Head: Normocephalic and atraumatic.     Right Ear: Tympanic membrane, ear canal and external ear normal.     Left Ear: Tympanic membrane, ear canal and external ear normal.     Nose: Nose normal.     Mouth/Throat:     Mouth: Mucous membranes are moist.     Pharynx: Oropharynx  is clear. No oropharyngeal exudate.  Eyes:     General: No scleral icterus.    Conjunctiva/sclera: Conjunctivae normal.     Pupils: Pupils are equal, round, and reactive to light.  Neck:     Thyroid: No thyromegaly.  Cardiovascular:     Rate and Rhythm: Normal rate and regular rhythm.     Pulses: Normal pulses.     Heart sounds: Normal heart sounds. No murmur heard. Pulmonary:     Effort: Pulmonary effort is normal. No respiratory distress.     Breath sounds: Normal breath sounds. No wheezing or rales.  Chest:     Comments: Breasts: breasts appear normal, no suspicious masses, no skin or nipple changes or axillary nodes  Abdominal:     General: There is no distension.     Palpations: Abdomen is soft.     Tenderness: There is no abdominal tenderness.  Musculoskeletal:        General: No deformity.     Cervical back: Neck supple.     Right lower leg: No edema.     Left lower leg: No edema.  Lymphadenopathy:     Cervical: No cervical adenopathy.  Skin:    General: Skin is warm and dry.     Findings: No rash.  Neurological:     Mental Status: She is alert and oriented to person, place, and time. Mental status is at baseline.     Sensory: No sensory deficit.     Motor: No weakness.     Gait: Gait normal.  Psychiatric:        Mood and Affect: Mood normal.        Behavior: Behavior normal.        Thought Content: Thought content normal.      Last depression screening scores PHQ 2/9 Scores 11/24/2021 12/27/2020 09/06/2020  PHQ - 2 Score '6 2 5  ' PHQ- 9 Score '18 8 12   ' Last fall risk screening Fall Risk  11/24/2021  Falls in the past year? 0  Number falls in past yr: 0  Injury  with Fall? 0  Risk for fall due to : -  Follow up -   Last Audit-C alcohol use screening Alcohol Use Disorder Test (AUDIT) 11/24/2021  1. How often do you have a drink containing alcohol? 1  2. How many drinks containing alcohol do you have on a typical day when you are drinking? 0  3. How often do you have six or more drinks on one occasion? 0  AUDIT-C Score 1  Alcohol Brief Interventions/Follow-up -   A score of 3 or more in women, and 4 or more in men indicates increased risk for alcohol abuse, EXCEPT if all of the points are from question 1   No results found for any visits on 11/24/21.  Assessment & Plan    Routine Health Maintenance and Physical Exam  Exercise Activities and Dietary recommendations  Goals   None     Immunization History  Administered Date(s) Administered   Influenza Inj Mdck Quad Pf 08/12/2018   Influenza,inj,Quad PF,6+ Mos 10/09/2017, 08/14/2019   Influenza-Unspecified 10/13/2021   Moderna Sars-Covid-2 Vaccination 10/04/2020   Tdap 01/17/2018    Health Maintenance  Topic Date Due   Pneumococcal Vaccine 37-32 Years old (1 - PCV) Never done   PAP SMEAR-Modifier  11/01/2020   COVID-19 Vaccine (2 - Moderna risk series) 11/01/2020   TETANUS/TDAP  01/18/2028   INFLUENZA VACCINE  Completed   Hepatitis C Screening  Completed  HIV Screening  Completed   HPV VACCINES  Aged Out    Discussed health benefits of physical activity, and encouraged her to engage in regular exercise appropriate for her age and condition.  Problem List Items Addressed This Visit       Endocrine   Hypothyroidism due to Hashimoto's thyroiditis    Previously well controlled Continue Synthroid at current dose  Recheck TSH and adjust Synthroid as indicated        Relevant Orders   TSH     Other   Obesity (BMI 35.0-39.9 without comorbidity)    Discussed importance of healthy weight management Discussed diet and exercise  Continue ozempic      Relevant Orders   Lipid  Panel With LDL/HDL Ratio   Vitamin D deficiency   Relevant Orders   VITAMIN D 25 Hydroxy (Vit-D Deficiency, Fractures)   Vitamin B12 deficiency   Relevant Orders   Vitamin B12   Prediabetes    Recommend low carb diet Recheck A1c  Continue ozempic - also for weight loss      Relevant Orders   Hemoglobin A1c   Current moderate episode of major depressive disorder without prior episode ([place])    Followed by a therapist Does not want to try any medications now or in the future Chronic and stable Contracted for safety      GAD (generalized anxiety disorder)    Chronic and stable Declines meds Continue therapy      Other Visit Diagnoses     Encounter for annual physical exam    -  Primary   Relevant Orders   Comprehensive metabolic panel   Lipid Panel With LDL/HDL Ratio   Hemoglobin A1c   Vitamin B12   VITAMIN D 25 Hydroxy (Vit-D Deficiency, Fractures)   TSH   CBC w/Diff/Platelet        Return in about 2 months (around 01/22/2022) for MDD/GAD f/u.     I, Lavon Paganini, MD, have reviewed all documentation for this visit. The documentation on 11/24/21 for the exam, diagnosis, procedures, and orders are all accurate and complete.   Darcella Shiffman, Dionne Bucy, MD, MPH [facility]

## 2021-11-24 ENCOUNTER — Ambulatory Visit (INDEPENDENT_AMBULATORY_CARE_PROVIDER_SITE_OTHER): Payer: 59 | Admitting: Family Medicine

## 2021-11-24 ENCOUNTER — Other Ambulatory Visit: Payer: Self-pay

## 2021-11-24 ENCOUNTER — Encounter: Payer: Self-pay | Admitting: Family Medicine

## 2021-11-24 VITALS — BP 110/79 | HR 81 | Resp 16 | Ht 64.0 in | Wt 205.6 lb

## 2021-11-24 DIAGNOSIS — F411 Generalized anxiety disorder: Secondary | ICD-10-CM

## 2021-11-24 DIAGNOSIS — E538 Deficiency of other specified B group vitamins: Secondary | ICD-10-CM

## 2021-11-24 DIAGNOSIS — E669 Obesity, unspecified: Secondary | ICD-10-CM

## 2021-11-24 DIAGNOSIS — R7303 Prediabetes: Secondary | ICD-10-CM

## 2021-11-24 DIAGNOSIS — E038 Other specified hypothyroidism: Secondary | ICD-10-CM

## 2021-11-24 DIAGNOSIS — Z124 Encounter for screening for malignant neoplasm of cervix: Secondary | ICD-10-CM

## 2021-11-24 DIAGNOSIS — Z0000 Encounter for general adult medical examination without abnormal findings: Secondary | ICD-10-CM | POA: Diagnosis not present

## 2021-11-24 DIAGNOSIS — E559 Vitamin D deficiency, unspecified: Secondary | ICD-10-CM

## 2021-11-24 DIAGNOSIS — F321 Major depressive disorder, single episode, moderate: Secondary | ICD-10-CM

## 2021-11-24 DIAGNOSIS — E063 Autoimmune thyroiditis: Secondary | ICD-10-CM

## 2021-11-24 NOTE — Assessment & Plan Note (Signed)
Chronic and stable Declines meds Continue therapy

## 2021-11-24 NOTE — Assessment & Plan Note (Signed)
Previously well controlled Continue Synthroid at current dose  Recheck TSH and adjust Synthroid as indicated   

## 2021-11-24 NOTE — Assessment & Plan Note (Signed)
Followed by a therapist Does not want to try any medications now or in the future Chronic and stable Contracted for safety

## 2021-11-24 NOTE — Assessment & Plan Note (Signed)
Discussed importance of healthy weight management Discussed diet and exercise  Continue ozempic

## 2021-11-24 NOTE — Assessment & Plan Note (Signed)
Recommend low carb diet Recheck A1c  Continue ozempic - also for weight loss

## 2021-11-26 LAB — CBC WITH DIFFERENTIAL/PLATELET
Basophils Absolute: 0 10*3/uL (ref 0.0–0.2)
Basos: 0 %
EOS (ABSOLUTE): 0.1 10*3/uL (ref 0.0–0.4)
Eos: 1 %
Hematocrit: 38.7 % (ref 34.0–46.6)
Hemoglobin: 12.6 g/dL (ref 11.1–15.9)
Immature Grans (Abs): 0 10*3/uL (ref 0.0–0.1)
Immature Granulocytes: 0 %
Lymphocytes Absolute: 2.3 10*3/uL (ref 0.7–3.1)
Lymphs: 31 %
MCH: 26.6 pg (ref 26.6–33.0)
MCHC: 32.6 g/dL (ref 31.5–35.7)
MCV: 82 fL (ref 79–97)
Monocytes Absolute: 0.5 10*3/uL (ref 0.1–0.9)
Monocytes: 6 %
Neutrophils Absolute: 4.6 10*3/uL (ref 1.4–7.0)
Neutrophils: 62 %
Platelets: 327 10*3/uL (ref 150–450)
RBC: 4.74 x10E6/uL (ref 3.77–5.28)
RDW: 13.5 % (ref 11.7–15.4)
WBC: 7.5 10*3/uL (ref 3.4–10.8)

## 2021-11-26 LAB — COMPREHENSIVE METABOLIC PANEL
ALT: 20 IU/L (ref 0–32)
AST: 16 IU/L (ref 0–40)
Albumin/Globulin Ratio: 1.7 (ref 1.2–2.2)
Albumin: 4.2 g/dL (ref 3.8–4.8)
Alkaline Phosphatase: 59 IU/L (ref 44–121)
BUN/Creatinine Ratio: 13 (ref 9–23)
BUN: 9 mg/dL (ref 6–20)
Bilirubin Total: 0.3 mg/dL (ref 0.0–1.2)
CO2: 21 mmol/L (ref 20–29)
Calcium: 9.1 mg/dL (ref 8.7–10.2)
Chloride: 105 mmol/L (ref 96–106)
Creatinine, Ser: 0.67 mg/dL (ref 0.57–1.00)
Globulin, Total: 2.5 g/dL (ref 1.5–4.5)
Glucose: 110 mg/dL — ABNORMAL HIGH (ref 70–99)
Potassium: 4.5 mmol/L (ref 3.5–5.2)
Sodium: 139 mmol/L (ref 134–144)
Total Protein: 6.7 g/dL (ref 6.0–8.5)
eGFR: 116 mL/min/{1.73_m2} (ref >59)

## 2021-11-26 LAB — LIPID PANEL WITH LDL/HDL RATIO
Cholesterol, Total: 222 mg/dL — ABNORMAL HIGH (ref 100–199)
HDL: 49 mg/dL (ref >39)
LDL Chol Calc (NIH): 154 mg/dL — ABNORMAL HIGH (ref 0–99)
LDL/HDL Ratio: 3.1 ratio (ref 0.0–3.2)
Triglycerides: 104 mg/dL (ref 0–149)
VLDL Cholesterol Cal: 19 mg/dL (ref 5–40)

## 2021-11-26 LAB — HEMOGLOBIN A1C
Est. average glucose Bld gHb Est-mCnc: 120 mg/dL
Hgb A1c MFr Bld: 5.8 % — ABNORMAL HIGH (ref 4.8–5.6)

## 2021-11-26 LAB — VITAMIN B12: Vitamin B-12: 1510 pg/mL — ABNORMAL HIGH (ref 232–1245)

## 2021-11-26 LAB — TSH: TSH: 0.798 u[IU]/mL (ref 0.450–4.500)

## 2021-11-26 LAB — VITAMIN D 25 HYDROXY (VIT D DEFICIENCY, FRACTURES): Vit D, 25-Hydroxy: 30.3 ng/mL (ref 30.0–100.0)

## 2021-11-29 ENCOUNTER — Encounter: Payer: Self-pay | Admitting: Family Medicine

## 2022-01-30 ENCOUNTER — Ambulatory Visit: Payer: 59 | Admitting: Family Medicine

## 2022-03-11 ENCOUNTER — Other Ambulatory Visit: Payer: Self-pay | Admitting: Family Medicine

## 2022-03-13 NOTE — Telephone Encounter (Signed)
Requested Prescriptions  ?Pending Prescriptions Disp Refills  ?? levothyroxine (SYNTHROID) 50 MCG tablet [Pharmacy Med Name: LEVOTHYROXINE 50 MCG TABLET] 90 tablet 2  ?  Sig: TAKE 1 TABLET BY MOUTH EVERY DAY  ?  ? Endocrinology:  Hypothyroid Agents Passed - 03/11/2022  9:23 AM  ?  ?  Passed - TSH in normal range and within 360 days  ?  TSH  ?Date Value Ref Range Status  ?11/25/2021 0.798 0.450 - 4.500 uIU/mL Final  ?   ?  ?  Passed - Valid encounter within last 12 months  ?  Recent Outpatient Visits   ?      ? 3 months ago Encounter for annual physical exam  ? [facility], Dionne Bucy, MD  ? 10 months ago Left lower quadrant abdominal pain  ? Orrum, NP  ? 1 year ago Obesity (BMI 30.0-34.9)  ? [facility], Dionne Bucy, MD  ? 1 year ago Obesity (BMI 30.0-34.9)  ? [facility], Dionne Bucy, MD  ? 1 year ago Encounter for annual physical exam  ? [facility], Dionne Bucy, MD  ?  ?  ?Future Appointments   ?        ? In 3 weeks Bacigalupo, Dionne Bucy, MD [facility]  ? In 2 months Bacigalupo, Dionne Bucy, MD [facility]  ?  ? ?  ?  ?  ? ?

## 2022-03-27 ENCOUNTER — Other Ambulatory Visit: Payer: Self-pay | Admitting: Family Medicine

## 2022-04-03 ENCOUNTER — Ambulatory Visit: Payer: 59 | Admitting: Family Medicine

## 2022-05-23 NOTE — Progress Notes (Unsigned)
I,Sulibeya S Dimas,acting as a Neurosurgeon for [employer], PA-C.,have documented all relevant documentation on the behalf of Debera Lat, PA-C,as directed by  [employer], PA-C while in the presence of [employer], PA-C.   Established patient visit   Patient: Shelly Coleman   DOB: [birth year]   36 y.o. Female  MRN: [id number] Visit Date: 05/24/2022  Today's healthcare provider: Debera Lat, PA-C   Chief Complaint  Patient presents with   Obesity   Subjective    Patient C/O possible GERD. She reports being under stress. Was let go from her previous job. She is currently looking for work.   Follow up for obesity/pre diabetic   The patient was last seen for this 6 months ago. Changes made at last visit include no change.  She reports fair compliance with treatment. Patient reports she missed 3 weeks. Then used on Friday 05/19/22.  She feels that condition is Unchanged. She is having side effects. Burning sensation on throat started on Saturday. Patient reports taking Nexium and Tums and reports good symptom control. Stopped taking Ozempic Patient reports she does not participate in regular exercise. Patient reports she is vegetarian.   Lab Results  Component Value Date   HGBA1C 5.8 (H) 11/25/2021    Wt Readings from Last 3 Encounters:  05/24/22 203 lb 14.4 oz (92.5 kg)  11/24/21 205 lb 9.6 oz (93.3 kg)  06/16/21 200 lb (90.7 kg)    Has been having a lot of stress at work/currently unemployed Start going to gym 30 min  ---------------------------------------------------------------------------------  Medications: Outpatient Medications Prior to Visit  Medication Sig   levothyroxine (SYNTHROID) 50 MCG tablet TAKE 1 TABLET BY MOUTH EVERY DAY   Multiple Vitamin (MULTIVITAMIN WITH MINERALS) TABS tablet Take 1 tablet by mouth 2 (two) times a week.   OZEMPIC, 1 MG/DOSE, 4 MG/3ML SOPN INJECT 1MG  INTO THE SKIN ONCE A WEEK   No facility-administered medications prior to  visit.    Review of Systems  Constitutional:  Negative for appetite change.  Respiratory:  Negative for chest tightness and shortness of breath.   Cardiovascular:  Positive for chest pain. Negative for palpitations.  Gastrointestinal:  Positive for abdominal distention and constipation. Negative for abdominal pain, diarrhea, nausea and vomiting.       Objective    BP 114/68 (BP Location: Right Arm, Patient Position: Sitting, Cuff Size: Large)   Pulse 80   Temp 98.2 F (36.8 C) (Oral)   Resp 16   Ht 5\' 4"  (1.626 m)   Wt 203 lb 14.4 oz (92.5 kg)   LMP 04/18/2022 (Approximate)   SpO2 100%   BMI 35.00 kg/m  BP Readings from Last 3 Encounters:  05/24/22 114/68  11/24/21 110/79  06/16/21 132/90   Wt Readings from Last 3 Encounters:  05/24/22 203 lb 14.4 oz (92.5 kg)  11/24/21 205 lb 9.6 oz (93.3 kg)  06/16/21 200 lb (90.7 kg)      Physical Exam Vitals reviewed.  Constitutional:      General: She is not in acute distress.    Appearance: Normal appearance. She is well-developed. She is obese. She is not diaphoretic.  HENT:     Head: Normocephalic and atraumatic.     Nose: Congestion (L>R? pt denies) present.     Mouth/Throat:     Comments: Enlarged tonsils bilaterally noted Postnasal drainage appreciated? Eyes:     General: No scleral icterus.    Extraocular Movements: Extraocular movements intact.     Conjunctiva/sclera: Conjunctivae  normal.     Pupils: Pupils are equal, round, and reactive to light.  Neck:     Thyroid: No thyromegaly.     Comments: Diffuse goiter noted Cardiovascular:     Rate and Rhythm: Normal rate and regular rhythm.     Pulses: Normal pulses.     Heart sounds: Normal heart sounds. No murmur heard. Pulmonary:     Effort: Pulmonary effort is normal. No respiratory distress.     Breath sounds: Normal breath sounds. No wheezing, rhonchi or rales.  Musculoskeletal:     Cervical back: Normal range of motion and neck supple.     Right lower leg:  No edema.     Left lower leg: No edema.  Lymphadenopathy:     Cervical: No cervical adenopathy.  Skin:    General: Skin is warm and dry.     Findings: No rash.  Neurological:     Mental Status: She is alert and oriented to person, place, and time. Mental status is at baseline.  Psychiatric:        Behavior: Behavior normal.        Thought Content: Thought content normal.        Judgment: Judgment normal.     No results found for any visits on 05/24/22.  Assessment & Plan     1. Obesity (BMI 35.0-39.9 without comorbidity) Bmi 35.0 - POCT glycosylated hemoglobin (Hb A1C) 5.3 Lifestyle modifications via healthy diet and regular exercise recommended  2. Prediabetes Family hx of DM - POCT glycosylated hemoglobin (Hb A1C) 5.3 Stop taking ozempic Lifestyle modifications via healthy diet and regular exercise recommended  3. Gastroesophageal reflux disease without esophagitis Could be realted to side effects of ozempic She started back on ozempic after a short break. Continue PPI/ Elevate the head of the bed 6-8 inches, avoid recumbency for 3 hours after eating, avoid food as a delayed gastric emptying, weight loss    4. Hypothyroidism TSH wnl but close to low limit of normal Might check T4 Continue levothyroxine  5. Anxiety Continue psychotherapy Advised on relaxation/medication techniques. At this point, pt prefers not to proceed with medications.  FU in 1 mo The patient was advised to call back or seek an in-person evaluation if the symptoms worsen or if the condition fails to improve as anticipated.  I discussed the assessment and treatment plan with the patient. The patient was provided an opportunity to ask questions and all were answered. The patient agreed with the plan and demonstrated an understanding of the instructions.  The entirety of the information documented in the History of Present Illness, Review of Systems and Physical Exam were personally obtained by  me. Portions of this information were initially documented by the CMA and reviewed by me for thoroughness and accuracy.  Portions of this note were created using dictation software and may contain typographical errors.       Total encounter time more than 30 minutes  Greater than 50% was spent in counseling and coordination of care with the patient   Cherlynn Polo  [facility] [phone] (phone) [phone] (fax)  [facility]

## 2022-05-24 ENCOUNTER — Encounter: Payer: Self-pay | Admitting: Physician Assistant

## 2022-05-24 ENCOUNTER — Ambulatory Visit: Payer: 59 | Admitting: Physician Assistant

## 2022-05-24 VITALS — BP 114/68 | HR 80 | Temp 98.2°F | Resp 16 | Ht 64.0 in | Wt 203.9 lb

## 2022-05-24 DIAGNOSIS — R7303 Prediabetes: Secondary | ICD-10-CM

## 2022-05-24 DIAGNOSIS — E038 Other specified hypothyroidism: Secondary | ICD-10-CM

## 2022-05-24 DIAGNOSIS — Z833 Family history of diabetes mellitus: Secondary | ICD-10-CM

## 2022-05-24 DIAGNOSIS — K219 Gastro-esophageal reflux disease without esophagitis: Secondary | ICD-10-CM | POA: Diagnosis not present

## 2022-05-24 DIAGNOSIS — E063 Autoimmune thyroiditis: Secondary | ICD-10-CM

## 2022-05-24 DIAGNOSIS — E669 Obesity, unspecified: Secondary | ICD-10-CM | POA: Diagnosis not present

## 2022-05-24 DIAGNOSIS — F411 Generalized anxiety disorder: Secondary | ICD-10-CM | POA: Diagnosis not present

## 2022-05-24 DIAGNOSIS — Z789 Other specified health status: Secondary | ICD-10-CM

## 2022-05-24 LAB — POCT GLYCOSYLATED HEMOGLOBIN (HGB A1C)
Est. average glucose Bld gHb Est-mCnc: 105
Hemoglobin A1C: 5.3 % (ref 4.0–5.6)

## 2022-05-24 MED ORDER — OMEPRAZOLE 40 MG PO CPDR: 40.0000 mg | DELAYED_RELEASE_CAPSULE | Freq: Every day | ORAL | 3 refills | Status: DC

## 2022-05-25 ENCOUNTER — Ambulatory Visit: Payer: 59 | Admitting: Family Medicine

## 2022-05-26 ENCOUNTER — Encounter: Payer: Self-pay | Admitting: Physician Assistant

## 2022-05-29 ENCOUNTER — Ambulatory Visit: Payer: Self-pay

## 2022-05-29 DIAGNOSIS — E063 Autoimmune thyroiditis: Secondary | ICD-10-CM

## 2022-05-29 DIAGNOSIS — E049 Nontoxic goiter, unspecified: Secondary | ICD-10-CM

## 2022-05-29 NOTE — Addendum Note (Signed)
Addended by: Debera Lat on: 05/29/2022 04:54 PM   Modules accepted: Orders

## 2022-05-29 NOTE — Telephone Encounter (Signed)
Please, let pt know that orders were placed. She could do labs anytime. Working hours - 8am till 5pm M-F

## 2022-05-29 NOTE — Telephone Encounter (Signed)
Pt informed

## 2022-05-29 NOTE — Telephone Encounter (Signed)
  Chief Complaint: Lab work Symptoms: Diffused goiter Frequency: ongoing Pertinent Negatives: Patient denies  Disposition: [] ED /[] Urgent Care (no appt availability in office) / [] Appointment(In office/virtual)/ []  [place] [facility]/ [] Home Care/ [] Refused Recommended Disposition /[] [place] [facility]/ [x]  Follow-up with PCP Additional Notes: Pt is requesting that testing for thyroid function be scheduled in the next few day rather than waiting until August. Pt is feeling unsettled without knowing if something is wrong.  Pt would like thyroid testing ordered. Pt will need a call back after order is placed so she knows when to come in for blood draw.  Reason for Disposition  Caller requesting routine or non-urgent lab result  Answer Assessment - Initial Assessment Questions 1. REASON FOR CALL or QUESTION: "What is your reason for calling today?" or "How can I best help you?" or "What question do you have that I can help answer?"     Pt requests that thyroid labs be done now rather than later.  2. CALLER: Document the source of call. (e.g., laboratory, patient).     Pt.  Protocols used: PCP Call - No Triage-A-AH

## 2022-06-01 LAB — TSH: TSH: 1.26 u[IU]/mL (ref 0.450–4.500)

## 2022-06-01 LAB — T4, FREE: Free T4: 1.23 ng/dL (ref 0.82–1.77)

## 2022-06-01 NOTE — Progress Notes (Signed)
Hello Shelly Coleman ,   Your labwork results all are within normal limits.  No changes need to be made to medications, and no further tests need to be ordered.  Any questions please reach out to the office or message me on MyChart!  Best, Debera Lat, PA-C

## 2022-06-02 IMAGING — CT CT ABD-PELV W/O CM
2 of 4 series · 17 of 46 positions shown, 19 images · non-contrast
Comparison: None.

CLINICAL DATA: Left lower quadrant pain

EXAM:
CT ABDOMEN AND PELVIS WITHOUT CONTRAST
TECHNIQUE: Multidetector CT imaging of the abdomen and pelvis was performed
following the standard protocol without IV contrast.

[Series 2: axial st · axial · 0.88mm/px · z∈[-474,-24]mm · 14 of 100 slices shown, 16 images]
[im 5/100  soft-tissue]
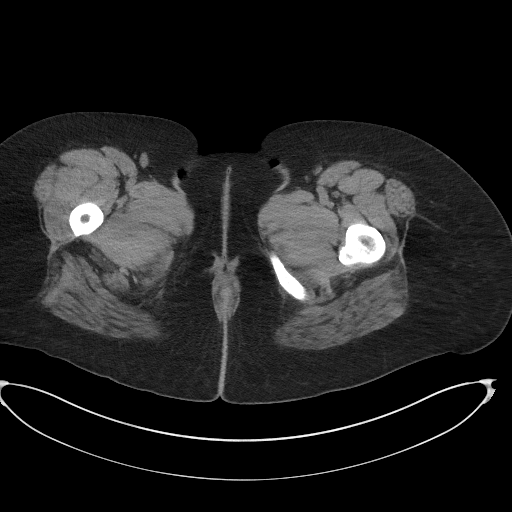
[im 5/100  bone]
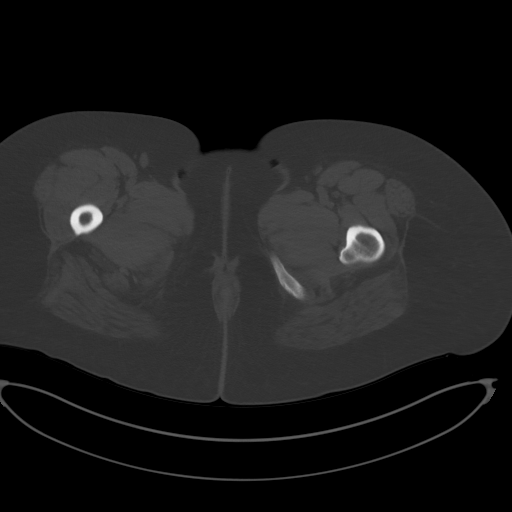
[im 13/100  soft-tissue]
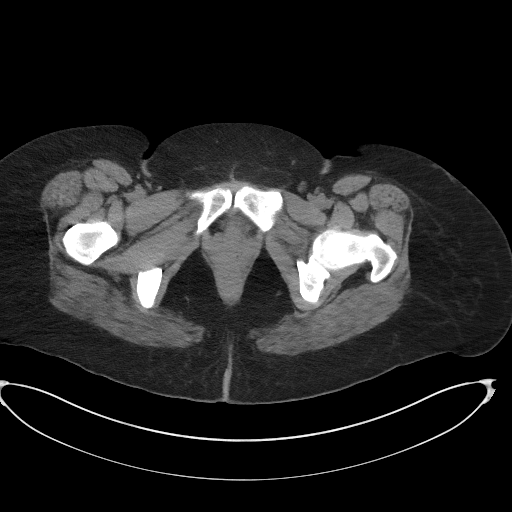
[im 18/100  soft-tissue]
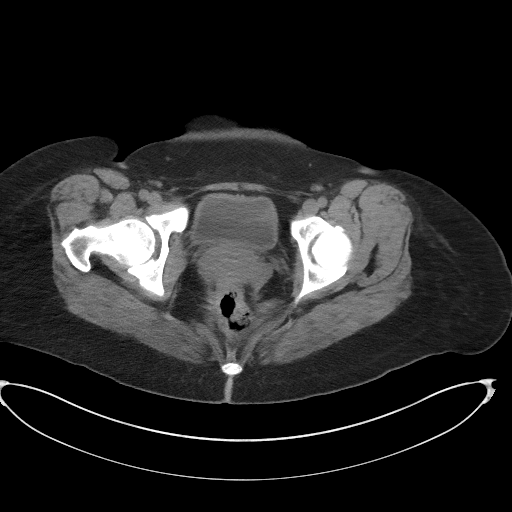
[im 26/100  soft-tissue]
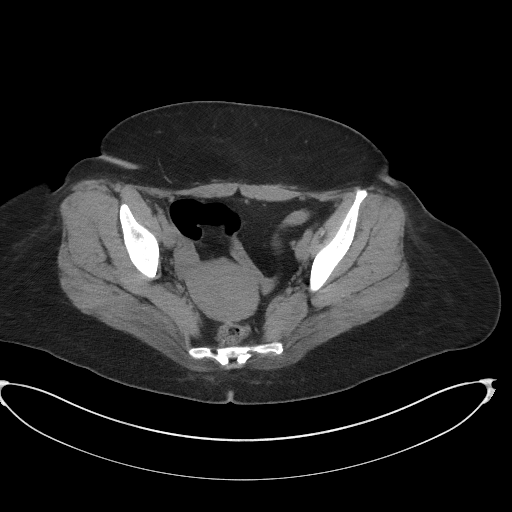
[im 35/100  soft-tissue]
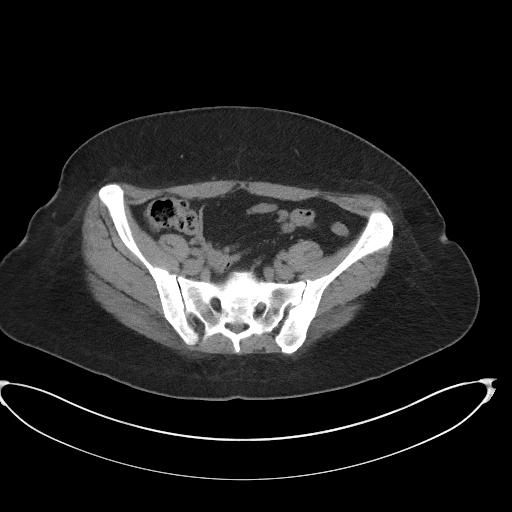
[im 39/100  soft-tissue]
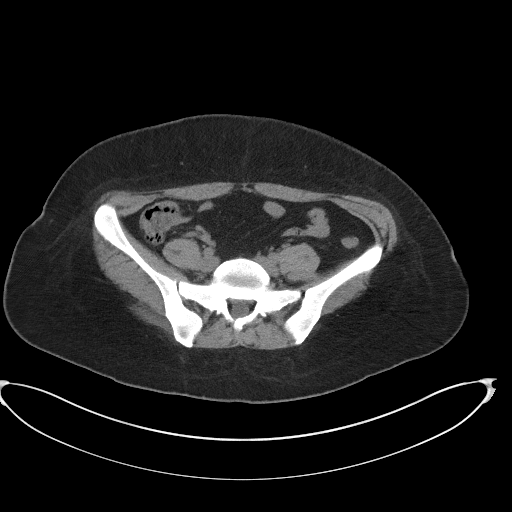
[im 48/100  soft-tissue]
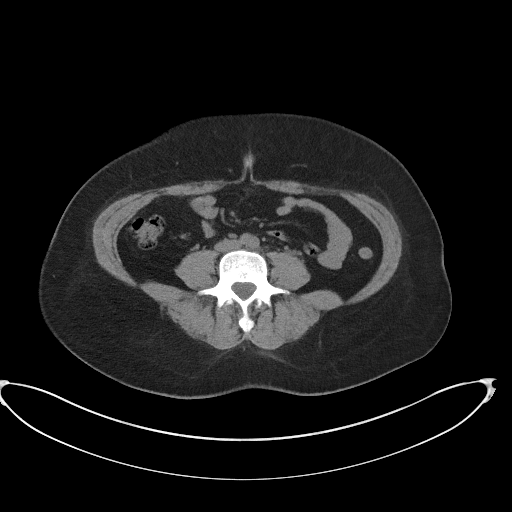
[im 52/100  soft-tissue]
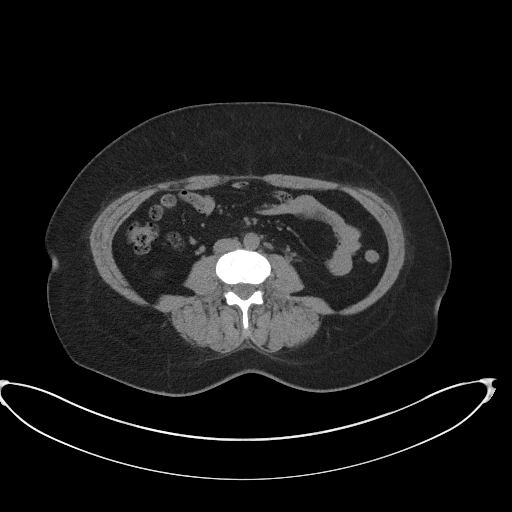
[im 61/100  soft-tissue]
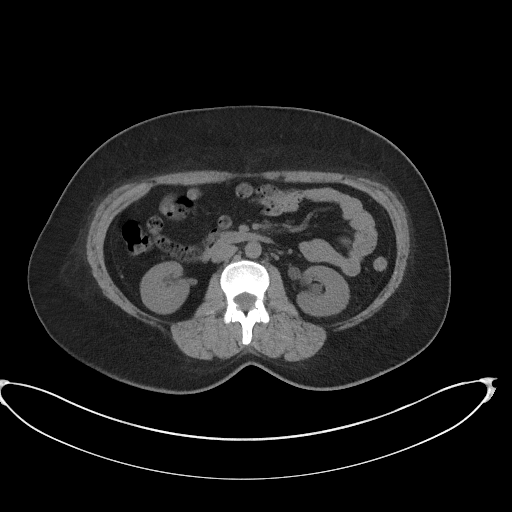
[im 61/100  bone]
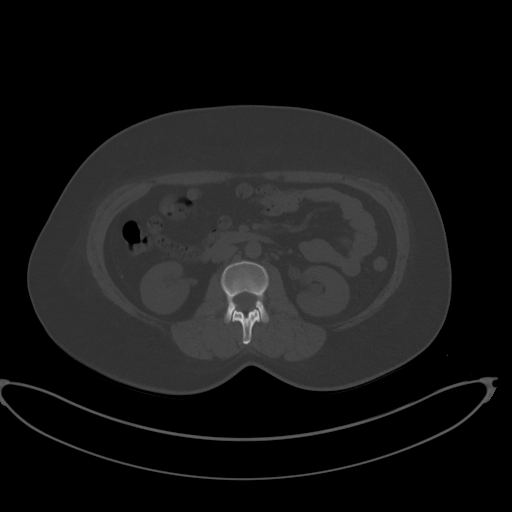
[im 65/100  soft-tissue]
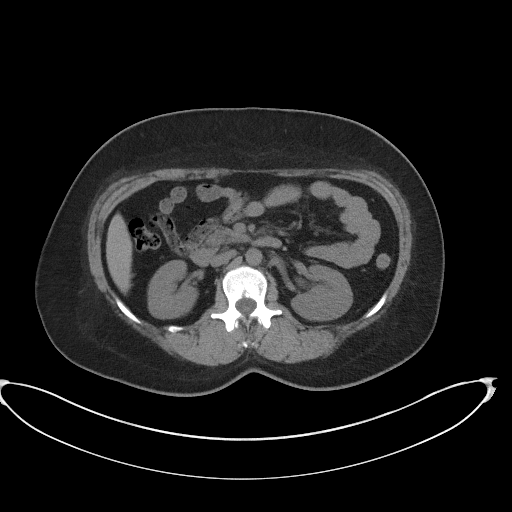
[im 74/100  soft-tissue]
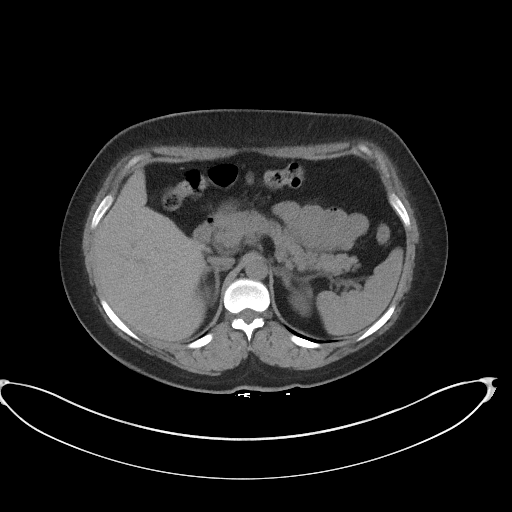
[im 82/100  soft-tissue]
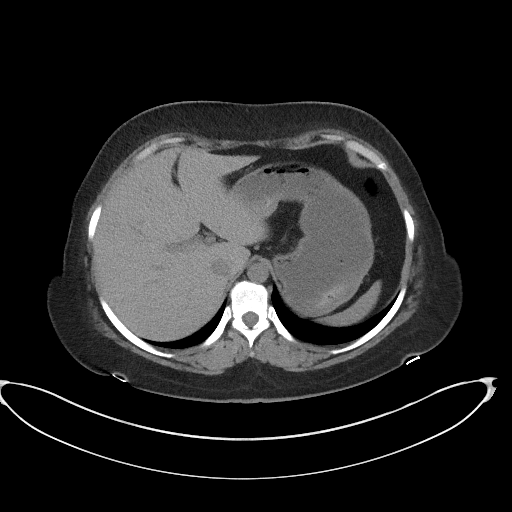
[im 87/100  soft-tissue]
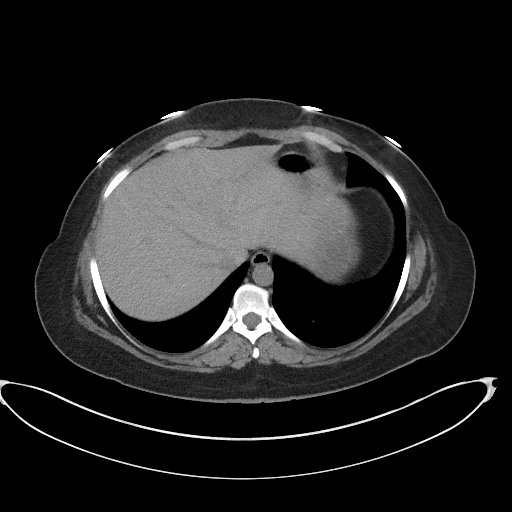
[im 95/100  soft-tissue]
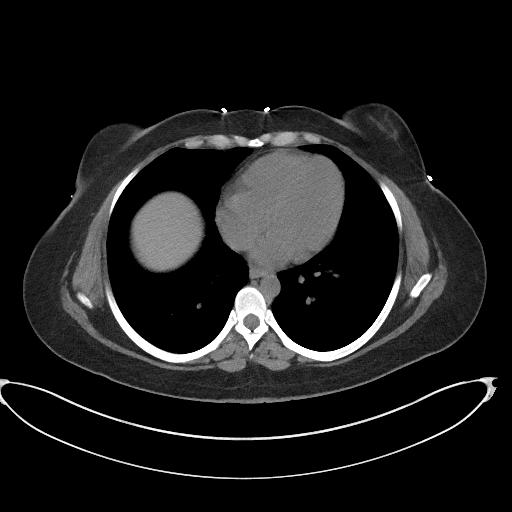

[Series 5: coronal st · coronal · 0.91mm/px · 3 of 101 slices shown]
[im 34/101  soft-tissue]
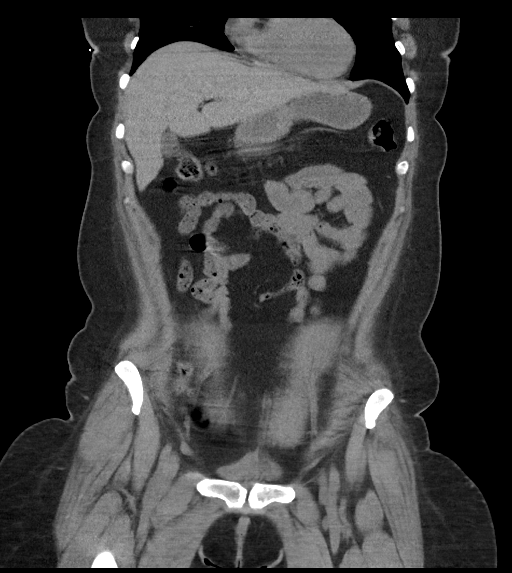
[im 45/101  soft-tissue]
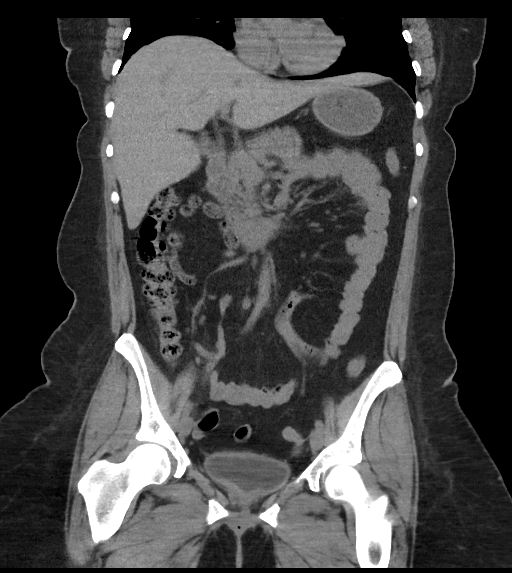
[im 56/101  soft-tissue]
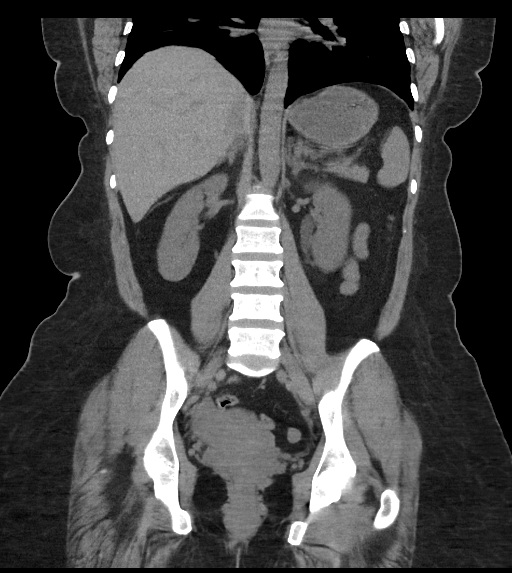

[17 of 46 positions shown; findings below may reference images not displayed]

FINDINGS: Lower chest: No acute abnormality.

Hepatobiliary: No focal liver abnormality is seen. No gallstones,
gallbladder wall thickening, or biliary dilatation.

Pancreas: Unremarkable. No pancreatic ductal dilatation or
surrounding inflammatory changes.

Spleen: Normal in size without focal abnormality.

Adrenals/Urinary Tract: Adrenal glands are normal. Mild left
hydronephrosis and proximal hydroureter, secondary to a 4 x 6 mm
stone in the proximal left ureter about 2 cm distal to the left UPJ.
The bladder is normal

Stomach/Bowel: Stomach is within normal limits. Appendix appears
normal. No evidence of bowel wall thickening, distention, or
inflammatory changes.

Vascular/Lymphatic: No significant vascular findings are present. No
enlarged abdominal or pelvic lymph nodes.

Reproductive: Uterus and bilateral adnexa are unremarkable.

Other: No abdominal wall hernia or abnormality. No abdominopelvic
ascites.

Musculoskeletal: No acute or significant osseous findings.
IMPRESSION: Mild left hydronephrosis and proximal hydroureter, secondary to a 4
x 6 mm stone in the proximal left ureter.

These results will be called to the ordering clinician or
representative by the Radiologist Assistant, and communication
documented in the PACS or [REDACTED].

## 2022-06-03 IMAGING — CR DG ABDOMEN 1V
2 series · 2 of 2 positions shown · non-contrast
Comparison: CT abdomen and pelvis April 26, 2021.

CLINICAL DATA: Nephrolithiasis

EXAM:
ABDOMEN - 1 VIEW

[abdomen kub (1 of 2)]
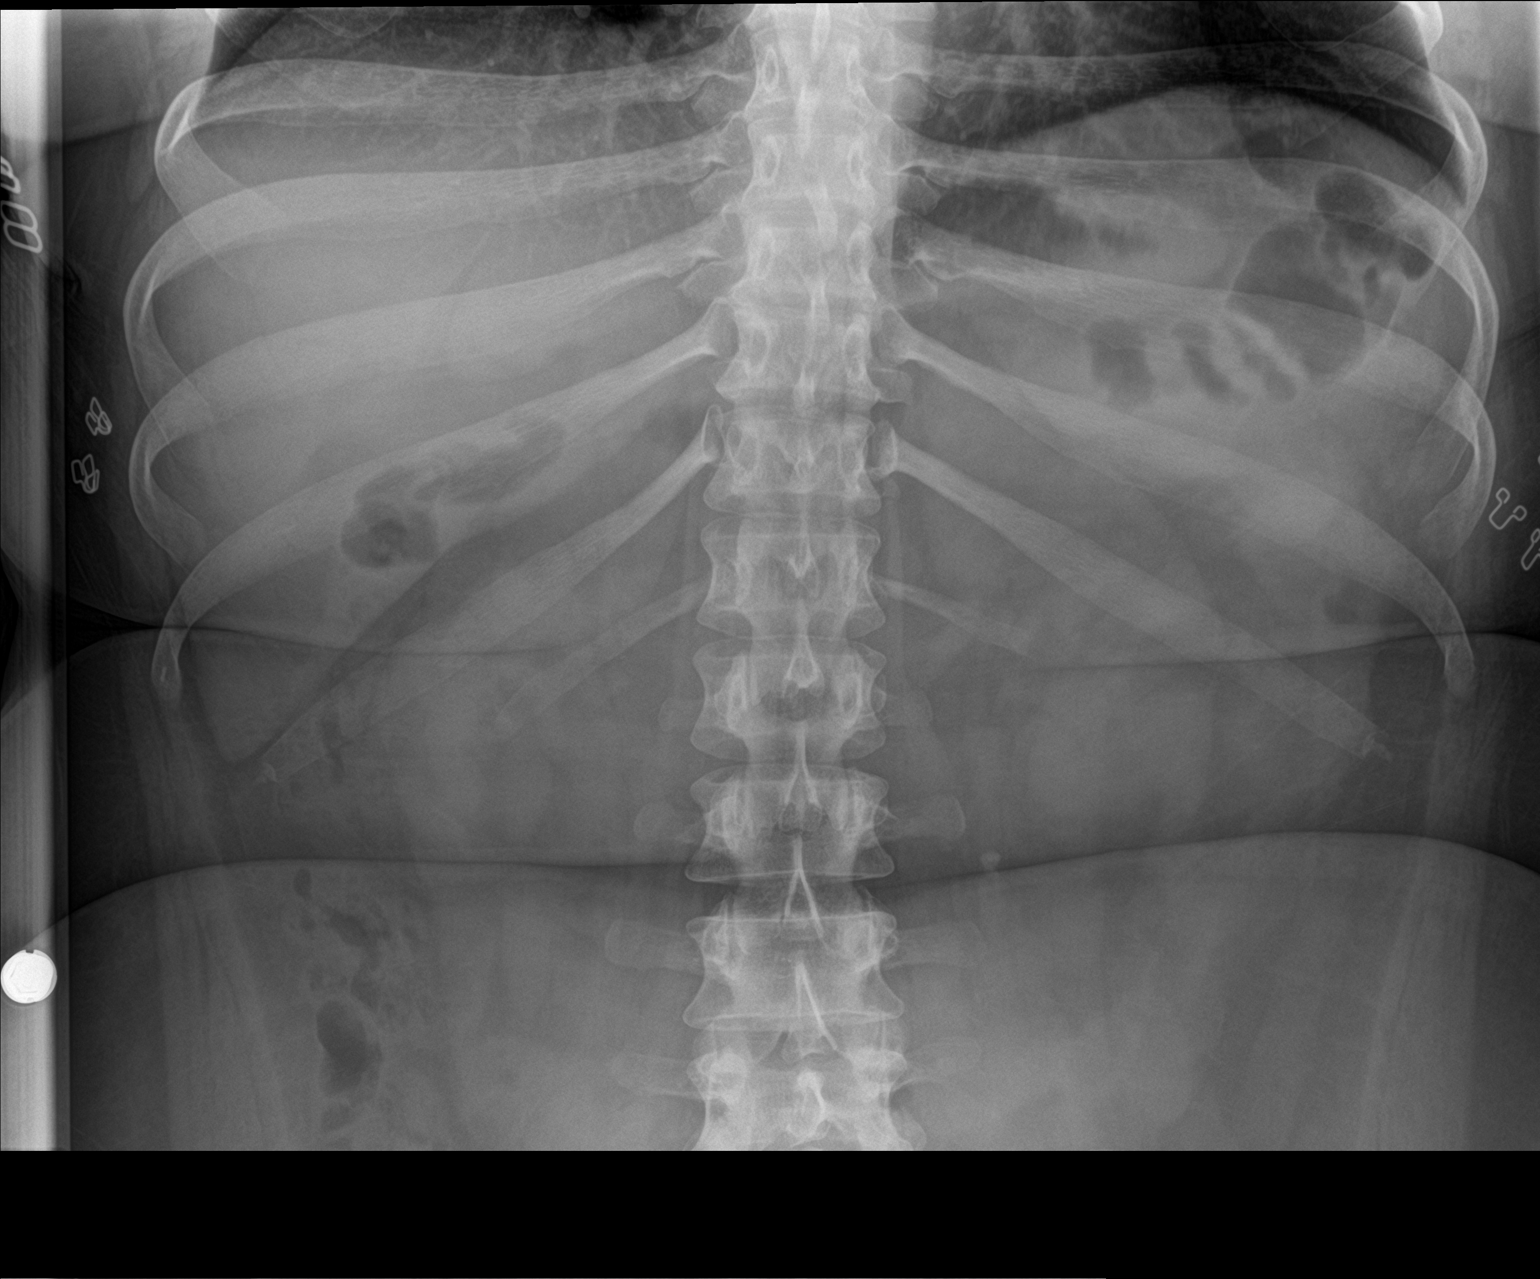

[abdomen kub (2 of 2)]
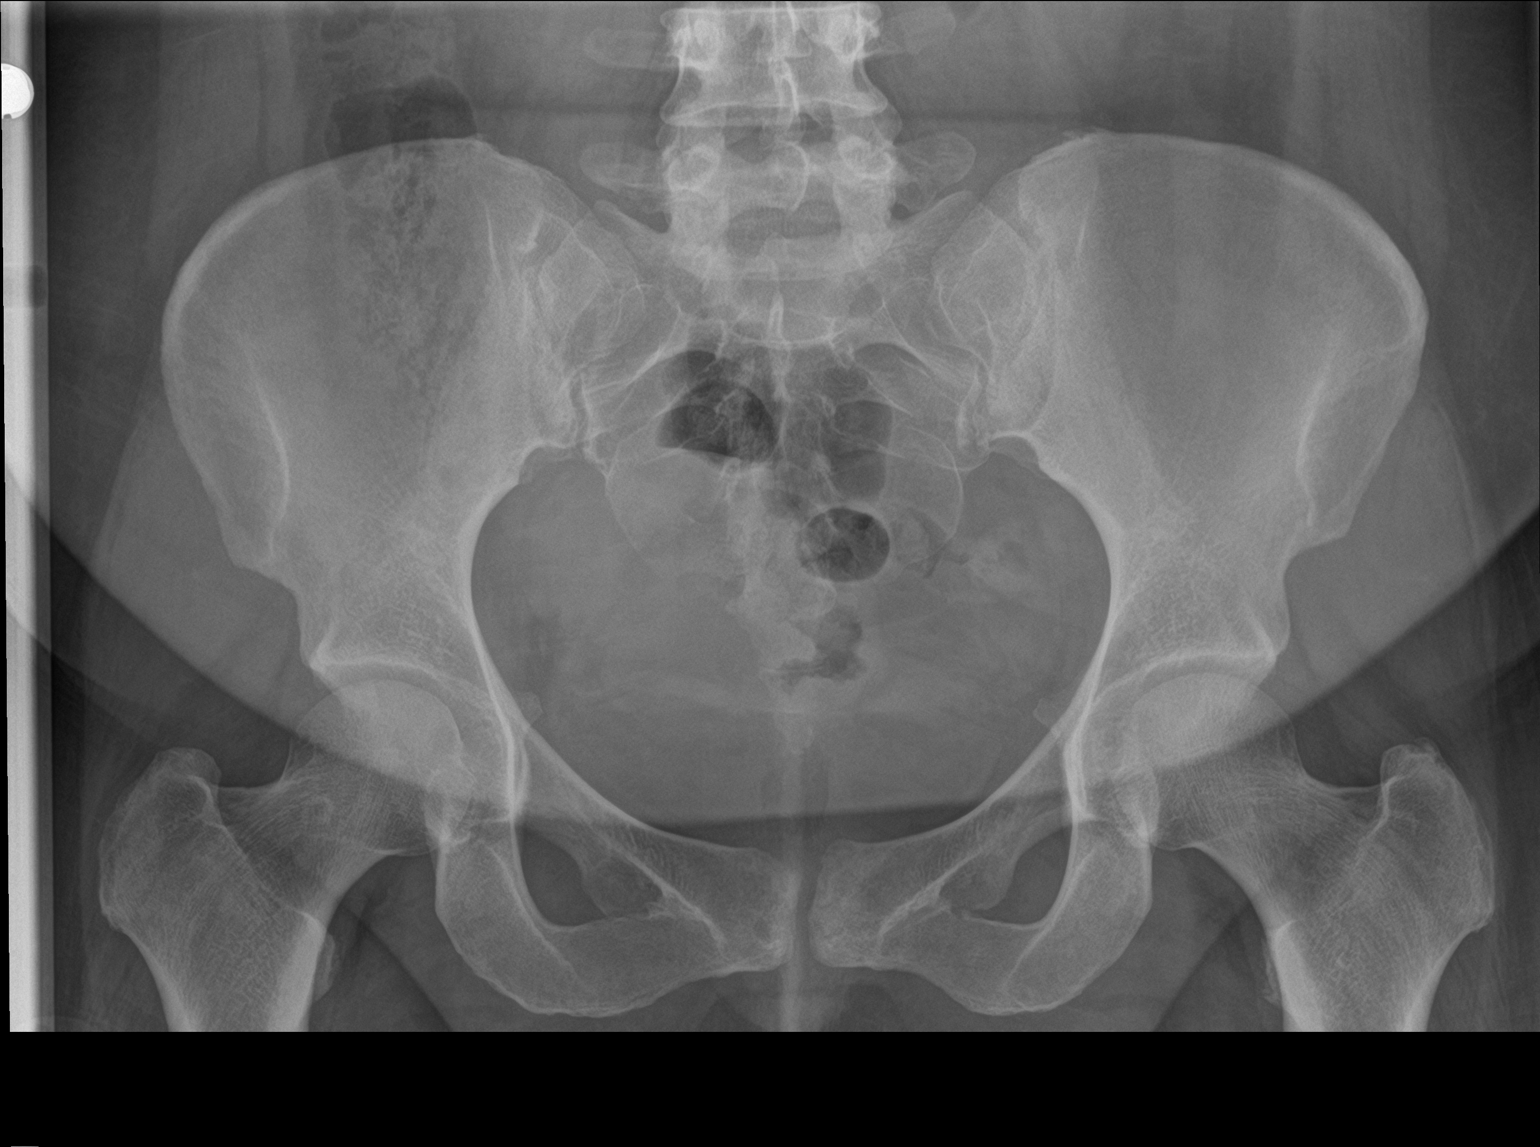

[2 of 2 positions shown; findings below may reference images not displayed]

FINDINGS: 5 x 4 mm calcification lateral to the L2-3 level, a likely proximal
ureteral calculus on the left, noted on recent CT examination. No
new calcifications evident. Moderate stool in colon. No bowel
dilatation or air-fluid level to suggest bowel obstruction. No free
air. Lung bases clear.
IMPRESSION: 5 x 4 mm apparent calculus proximal left ureter at the L2-3 level,
essentially stable from 2 days prior examination. No new
calcifications. No bowel obstruction or free air.

These results will be called to the ordering clinician or
representative by the Radiologist Assistant, and communication
documented in the PACS or [REDACTED].

## 2022-06-06 IMAGING — CR DG ABDOMEN 1V
1 series · 2 of 2 positions shown · non-contrast
Comparison: Abdominal radiograph 04/27/2021.

CLINICAL DATA: Patient with recent cystoscopy and stent placement.
Patient now leaking urine after pulling on the string.

EXAM:
ABDOMEN - 1 VIEW

[Series 1: dg abd 1 view · 0.14mm/px · 2 of 2 slices shown]
[im 1/2]
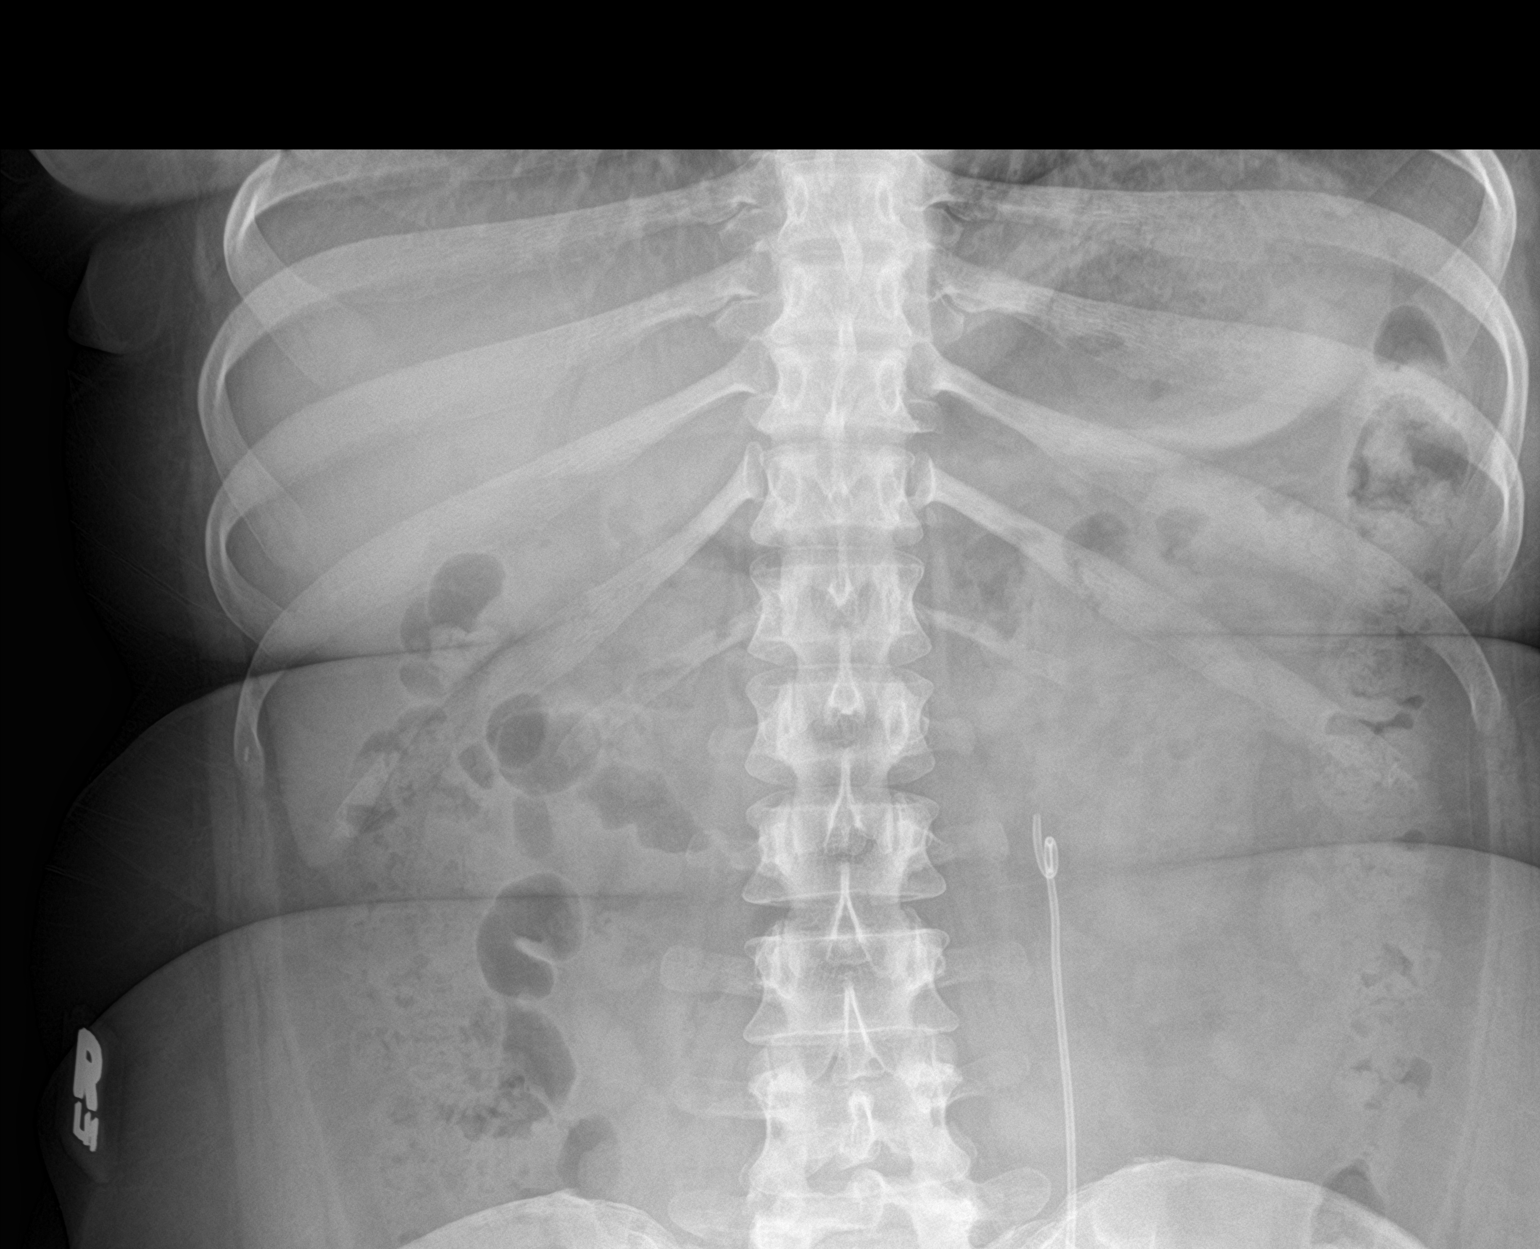
[im 2/2]
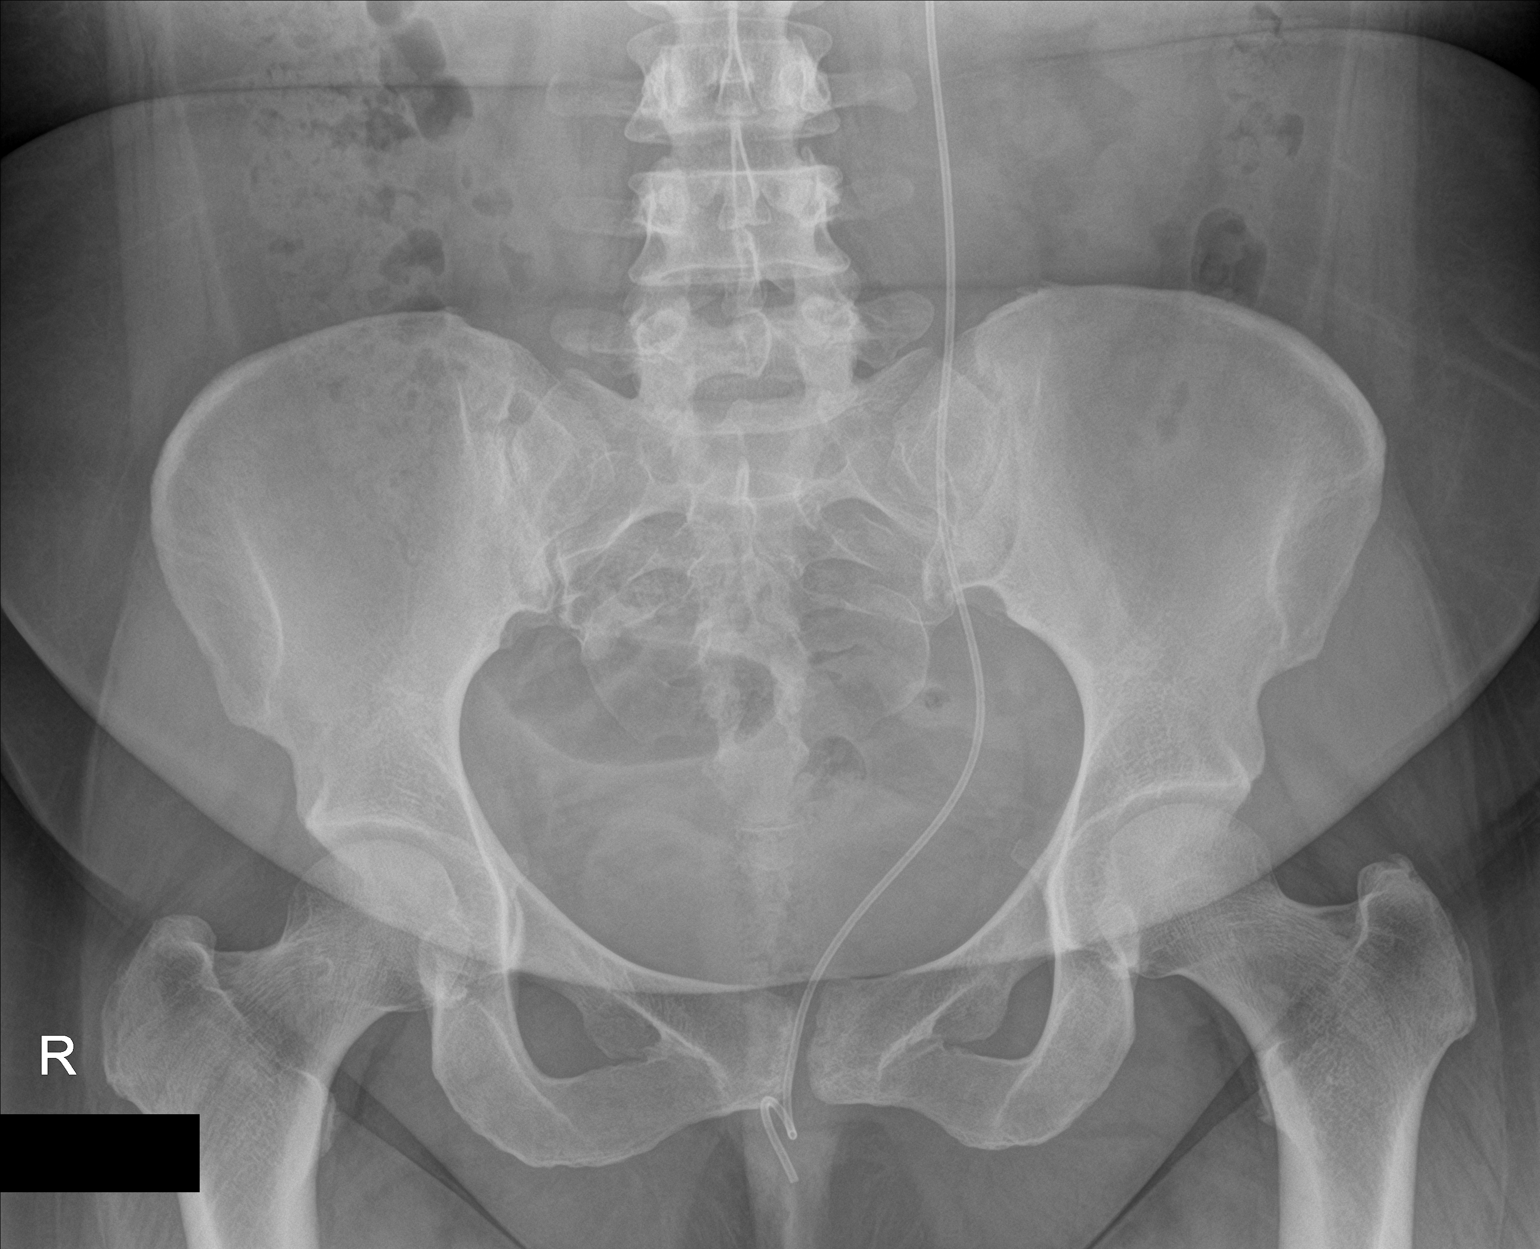

[2 of 2 positions shown; findings below may reference images not displayed]

FINDINGS: There appears to be inferior migration of the left ureteral stent.
The proximal pigtail catheter projects in the expected location the
proximal left ureter. The distal pigtail catheter projects over the
pubic symphysis. It is unclear if this is within the bladder or
urethra. Nonobstructed bowel gas pattern. Lung bases are clear.
IMPRESSION: There appears to be inferior migration of the left ureteral stent.
Recommend clinical correlation for location of the distal aspect of
the stent.

## 2022-06-22 ENCOUNTER — Ambulatory Visit: Payer: 59 | Admitting: Physician Assistant

## 2022-08-20 ENCOUNTER — Other Ambulatory Visit: Payer: Self-pay | Admitting: Physician Assistant

## 2022-08-20 DIAGNOSIS — K219 Gastro-esophageal reflux disease without esophagitis: Secondary | ICD-10-CM

## 2022-08-21 NOTE — Telephone Encounter (Signed)
Requested Prescriptions  Pending Prescriptions Disp Refills  . omeprazole (PRILOSEC) 40 MG capsule [Pharmacy Med Name: OMEPRAZOLE DR 40 MG CAPSULE] 90 capsule 1    Sig: TAKE 1 CAPSULE (40 MG TOTAL) BY MOUTH DAILY.     Gastroenterology: Proton Pump Inhibitors Passed - 08/20/2022  9:37 AM      Passed - Valid encounter within last 12 months    Recent Outpatient Visits          2 months ago Obesity (BMI 35.0-39.9 without comorbidity)   [facility] [place], [place], PA-C   9 months ago Encounter for annual physical exam   [facility] [place], Dionne Bucy, MD   1 year ago Left lower quadrant abdominal pain   [place], NP   1 year ago Obesity (BMI 30.0-34.67)   [facility] [place], Dionne Bucy, MD   1 year ago Obesity (BMI 30.0-34.9)   [facility] Bac[facility]al[facility], Dionne Bucy, MD

## 2022-11-16 ENCOUNTER — Telehealth: Payer: Self-pay | Admitting: Family Medicine

## 2022-11-16 DIAGNOSIS — E538 Deficiency of other specified B group vitamins: Secondary | ICD-10-CM

## 2022-11-16 DIAGNOSIS — E063 Autoimmune thyroiditis: Secondary | ICD-10-CM

## 2022-11-16 DIAGNOSIS — E669 Obesity, unspecified: Secondary | ICD-10-CM

## 2022-11-16 DIAGNOSIS — E559 Vitamin D deficiency, unspecified: Secondary | ICD-10-CM

## 2022-11-16 DIAGNOSIS — E038 Other specified hypothyroidism: Secondary | ICD-10-CM

## 2022-11-16 DIAGNOSIS — R7303 Prediabetes: Secondary | ICD-10-CM

## 2022-11-16 DIAGNOSIS — Z862 Personal history of diseases of the blood and blood-forming organs and certain disorders involving the immune mechanism: Secondary | ICD-10-CM

## 2022-11-16 NOTE — Telephone Encounter (Signed)
Patient is wanting to get labs done before her physical on 12/25/22. Please let patient know if she can do this.

## 2022-11-17 NOTE — Telephone Encounter (Signed)
Okay to reorder same labs from last year, including CMP, CBC, lipid panel, A1c, TSH, vitamin D, B12.  Please associate with her chronic medical conditions including prediabetes, obesity, history of anemia, hypothyroidism, B12 deficiency.  We can go ahead and order these.  She should be told to get these done fasting at least 3 days prior to her visit.

## 2022-11-21 NOTE — Addendum Note (Signed)
Addended by: Shawna Orleans on: 11/21/2022 12:03 PM   Modules accepted: Orders

## 2022-11-21 NOTE — Telephone Encounter (Signed)
Patient advised.

## 2022-12-21 LAB — LIPID PANEL WITH LDL/HDL RATIO
Cholesterol, Total: 215 mg/dL — ABNORMAL HIGH (ref 100–199)
HDL: 40 mg/dL (ref >39)
LDL Chol Calc (NIH): 146 mg/dL — ABNORMAL HIGH (ref 0–99)
LDL/HDL Ratio: 3.7 ratio — ABNORMAL HIGH (ref 0.0–3.2)
Triglycerides: 159 mg/dL — ABNORMAL HIGH (ref 0–149)
VLDL Cholesterol Cal: 29 mg/dL (ref 5–40)

## 2022-12-21 LAB — COMPREHENSIVE METABOLIC PANEL
ALT: 27 IU/L (ref 0–32)
AST: 14 IU/L (ref 0–40)
Albumin/Globulin Ratio: 1.6 (ref 1.2–2.2)
Albumin: 4.1 g/dL (ref 3.9–4.9)
Alkaline Phosphatase: 57 IU/L (ref 44–121)
BUN/Creatinine Ratio: 21 (ref 9–23)
BUN: 15 mg/dL (ref 6–20)
Bilirubin Total: 0.3 mg/dL (ref 0.0–1.2)
CO2: 20 mmol/L (ref 20–29)
Calcium: 9 mg/dL (ref 8.7–10.2)
Chloride: 105 mmol/L (ref 96–106)
Creatinine, Ser: 0.73 mg/dL (ref 0.57–1.00)
Globulin, Total: 2.5 g/dL (ref 1.5–4.5)
Glucose: 110 mg/dL — ABNORMAL HIGH (ref 70–99)
Potassium: 5 mmol/L (ref 3.5–5.2)
Sodium: 139 mmol/L (ref 134–144)
Total Protein: 6.6 g/dL (ref 6.0–8.5)
eGFR: 109 mL/min/{1.73_m2} (ref >59)

## 2022-12-21 LAB — CBC WITH DIFFERENTIAL/PLATELET
Basophils Absolute: 0 10*3/uL (ref 0.0–0.2)
Basos: 0 %
EOS (ABSOLUTE): 0.1 10*3/uL (ref 0.0–0.4)
Eos: 2 %
Hematocrit: 36.6 % (ref 34.0–46.6)
Hemoglobin: 12 g/dL (ref 11.1–15.9)
Immature Grans (Abs): 0 10*3/uL (ref 0.0–0.1)
Immature Granulocytes: 0 %
Lymphocytes Absolute: 2.2 10*3/uL (ref 0.7–3.1)
Lymphs: 32 %
MCH: 26.9 pg (ref 26.6–33.0)
MCHC: 32.8 g/dL (ref 31.5–35.7)
MCV: 82 fL (ref 79–97)
Monocytes Absolute: 0.4 10*3/uL (ref 0.1–0.9)
Monocytes: 5 %
Neutrophils Absolute: 4.2 10*3/uL (ref 1.4–7.0)
Neutrophils: 61 %
Platelets: 397 10*3/uL (ref 150–450)
RBC: 4.46 x10E6/uL (ref 3.77–5.28)
RDW: 13.3 % (ref 11.7–15.4)
WBC: 7 10*3/uL (ref 3.4–10.8)

## 2022-12-21 LAB — VITAMIN B12: Vitamin B-12: 466 pg/mL (ref 232–1245)

## 2022-12-21 LAB — TSH: TSH: 1.16 u[IU]/mL (ref 0.450–4.500)

## 2022-12-21 LAB — HEMOGLOBIN A1C
Est. average glucose Bld gHb Est-mCnc: 131 mg/dL
Hgb A1c MFr Bld: 6.2 % — ABNORMAL HIGH (ref 4.8–5.6)

## 2022-12-21 LAB — VITAMIN D 25 HYDROXY (VIT D DEFICIENCY, FRACTURES): Vit D, 25-Hydroxy: 32.3 ng/mL (ref 30.0–100.0)

## 2022-12-22 NOTE — Progress Notes (Unsigned)
I,Flo Berroa S Takari Lundahl,acting as a Education administrator for Lavon Paganini, MD.,have documented all relevant documentation on the behalf of Lavon Paganini, MD,as directed by  Lavon Paganini, MD while in the presence of Lavon Paganini, MD.    Complete physical exam   Patient: Shelly Coleman   DOB: 1986-09-20   37 y.o. Female  MRN: [id number] Visit Date: 12/25/2022  Today's healthcare provider: Lavon Paganini, MD   No chief complaint on file.  Subjective    Shelly Coleman is a 37 y.o. female who presents today for a complete physical exam.  She reports consuming a  vegetarian  diet. {Exercise:19826} She generally feels {well/fairly well/poorly:18703}. She reports sleeping {well/fairly well/poorly:18703}. She {does/does not:200015} have additional problems to discuss today.  HPI    Past Medical History:  Diagnosis Date   Migraine headache with aura    Obesity (BMI 35.0-39.9 without comorbidity)    Thyroid disease    hypothyroidism   Vegetarian diet    Past Surgical History:  Procedure Laterality Date   CESAREAN SECTION N/A 03/20/2018   Procedure: CESAREAN SECTION- STAT;  Surgeon: Homero Fellers, MD;  Location: [facility] ORS;  Service: Obstetrics;  Laterality: N/A;   CYSTOSCOPY/URETEROSCOPY/HOLMIUM LASER/STENT PLACEMENT Left 04/28/2021   Procedure: CYSTOSCOPY/URETEROSCOPY/HOLMIUM LASER/STENT PLACEMENT;  Surgeon: Billey Co, MD;  Location: [facility] ORS;  Service: Urology;  Laterality: Left;   Social History   Socioeconomic History   Marital status: Married    Spouse name: Binay   Number of children: 1   Years of education: 18   Highest education level: Not on file  Occupational History   Occupation: mortgage     Comment: [employer]  Tobacco Use   Smoking status: Never   Smokeless tobacco: Never  Vaping Use   Vaping Use: Never used  Substance and Sexual Activity   Alcohol use: Yes    Comment: rare   Drug use: No   Sexual activity: Yes    Partners: Male    Birth  control/protection: Condom  Other Topics Concern   Not on file  Social History Narrative   Not on file   Social Determinants of Health   Financial Resource Strain: Not on file  Food Insecurity: Not on file  Transportation Needs: Not on file  Physical Activity: Not on file  Stress: Not on file  Social Connections: Not on file  Intimate Partner Violence: Not on file   Family Status  Relation Name Status   MGF  Deceased at age 65   Father  Alive   [place]   PGF  Deceased   Mother  Alive   Brother  Alive   Son  Alive   MGM  Deceased   Neg Hx  (Not Specified)   Family History  Problem Relation Age of Onset   Leukemia Maternal Grandfather    Hypertension Father 91   Diabetes Father 70   Diabetes Paternal Grandmother 84   Thyroid disease Paternal Grandmother    Hypertension Paternal Grandfather 77   Diabetes Paternal Grandfather 39   Thyroid disease Mother    Healthy Brother    Healthy Son    Breast cancer Neg Hx    Colon cancer Neg Hx    No Known Allergies  Patient Care Team: Bacigalupo, Dionne Bucy, MD as PCP - General (Family Medicine)   Medications: Outpatient Medications Prior to Visit  Medication Sig   levothyroxine (SYNTHROID) 50 MCG tablet TAKE 1 TABLET BY MOUTH EVERY DAY   Multiple Vitamin (MULTIVITAMIN WITH MINERALS)  TABS tablet Take 1 tablet by mouth 2 (two) times a week.   omeprazole (PRILOSEC) 40 MG capsule TAKE 1 CAPSULE (40 MG TOTAL) BY MOUTH DAILY.   OZEMPIC, 1 MG/DOSE, 4 MG/3ML SOPN INJECT 1MG INTO THE SKIN ONCE A WEEK   No facility-administered medications prior to visit.    Review of Systems  {Labs  Heme  Chem  Endocrine  Serology  Results Review (optional):23779}  Objective    There were no vitals taken for this visit. {Show previous vital signs (optional):23777}   Physical Exam  ***  Last depression screening scores    05/24/2022    3:07 PM 11/24/2021    3:39 PM 12/27/2020   11:28 AM  PHQ 2/9 Scores  PHQ - 2 Score 2 6 2   $ PHQ- 9 Score 8 18 8   $ Last fall risk screening    05/24/2022    3:07 PM  [place] in the past year? 0  Number falls in past yr: 0  Injury with Fall? 0  Risk for fall due to : No Fall Risks  Follow up Falls evaluation completed   Last Audit-C alcohol use screening    05/24/2022    3:08 PM  Alcohol Use Disorder Test (AUDIT)  1. How often do you have a drink containing alcohol? 1  2. How many drinks containing alcohol do you have on a typical day when you are drinking? 0  3. How often do you have six or more drinks on one occasion? 0  AUDIT-C Score 1   A score of 3 or more in women, and 4 or more in men indicates increased risk for alcohol abuse, EXCEPT if all of the points are from question 1   No results found for any visits on 12/25/22.  Assessment & Plan    Routine Health Maintenance and Physical Exam  Exercise Activities and Dietary recommendations  Goals   None     Immunization History  Administered Date(s) Administered   Influenza Inj Mdck Quad Pf 08/12/2018   Influenza,inj,Quad PF,6+ Mos 10/09/2017, 08/14/2019   Influenza-Unspecified 10/13/2021   Moderna Sars-Covid-2 Vaccination 10/04/2020   Tdap 01/17/2018    Health Maintenance  Topic Date Due   PAP SMEAR-Modifier  11/01/2020   COVID-19 Vaccine (2 - Moderna risk series) 11/01/2020   INFLUENZA VACCINE  06/13/2022   DTaP/Tdap/Td (2 - Td or Tdap) 01/18/2028   Hepatitis C Screening  Completed   HIV Screening  Completed   HPV VACCINES  Aged Out    Discussed health benefits of physical activity, and encouraged her to engage in regular exercise appropriate for her age and condition.  ***  No follow-ups on file.     {provider attestation***:1}   Lavon Paganini, MD  [facility] [phone] (phone) [phone] (fax)  [place]

## 2022-12-23 ENCOUNTER — Other Ambulatory Visit: Payer: Self-pay | Admitting: Family Medicine

## 2022-12-25 ENCOUNTER — Encounter: Payer: Self-pay | Admitting: Family Medicine

## 2022-12-25 ENCOUNTER — Other Ambulatory Visit (HOSPITAL_COMMUNITY)
Admission: RE | Admit: 2022-12-25 | Discharge: 2022-12-25 | Disposition: A | Discharge status: Home / Self Care | Payer: 59 | Source: Ambulatory Visit | Attending: Family Medicine | Admitting: Family Medicine

## 2022-12-25 ENCOUNTER — Ambulatory Visit (INDEPENDENT_AMBULATORY_CARE_PROVIDER_SITE_OTHER): Payer: 59 | Admitting: Family Medicine

## 2022-12-25 VITALS — BP 112/75 | HR 72 | Temp 98.0°F | Resp 16 | Ht 64.0 in | Wt 205.9 lb

## 2022-12-25 DIAGNOSIS — Z23 Encounter for immunization: Secondary | ICD-10-CM | POA: Diagnosis not present

## 2022-12-25 DIAGNOSIS — Z0000 Encounter for general adult medical examination without abnormal findings: Secondary | ICD-10-CM | POA: Diagnosis not present

## 2022-12-25 DIAGNOSIS — Z124 Encounter for screening for malignant neoplasm of cervix: Secondary | ICD-10-CM

## 2022-12-25 DIAGNOSIS — R7303 Prediabetes: Secondary | ICD-10-CM

## 2022-12-25 DIAGNOSIS — E669 Obesity, unspecified: Secondary | ICD-10-CM

## 2022-12-25 MED ORDER — TIRZEPATIDE 2.5 MG/0.5ML ~~LOC~~ SOAJ: 2.5000 mg | SUBCUTANEOUS | 0 refills | Status: DC

## 2022-12-25 MED ORDER — TIRZEPATIDE 5 MG/0.5ML ~~LOC~~ SOAJ: 5.0000 mg | SUBCUTANEOUS | 2 refills | Status: DC

## 2022-12-25 NOTE — Assessment & Plan Note (Signed)
A1c has increased since stopping ozempic Will try mounjaro 2.5 mg weekly x4 weeks and then 72m weekly

## 2022-12-25 NOTE — Assessment & Plan Note (Signed)
Discussed importance of healthy weight management Discussed diet and exercise Trial of mounjaro as above

## 2022-12-27 LAB — CYTOLOGY - PAP
Comment: NEGATIVE
Diagnosis: NEGATIVE
High risk HPV: NEGATIVE

## 2023-01-19 ENCOUNTER — Other Ambulatory Visit: Payer: Self-pay | Admitting: Family Medicine

## 2023-01-19 DIAGNOSIS — R7303 Prediabetes: Secondary | ICD-10-CM

## 2023-03-29 ENCOUNTER — Ambulatory Visit: Payer: 59 | Admitting: Family Medicine

## 2023-09-22 ENCOUNTER — Other Ambulatory Visit: Payer: Self-pay | Admitting: Family Medicine

## 2023-09-22 DIAGNOSIS — R7303 Prediabetes: Secondary | ICD-10-CM

## 2023-09-24 NOTE — Telephone Encounter (Signed)
Requested medication (s) are due for refill today: yes  Requested medication (s) are on the active medication list: yes  Last refill:  12/25/22  Future visit scheduled: no  Notes to clinic:  Medication not assigned to a protocol, review manually.      Requested Prescriptions  Pending Prescriptions Disp Refills   MOUNJARO 5 MG/0.5ML Pen [Pharmacy Med Name: MOUNJARO 5 MG/0.5 ML PEN]  2    Sig: INJECT 5 MG SUBCUTANEOUSLY WEEKLY     Off-Protocol Failed - 09/22/2023  9:15 AM      Failed - Medication not assigned to a protocol, review manually.      Passed - Valid encounter within last 12 months    Recent Outpatient Visits           9 months ago Encounter for annual physical exam   [place] [facility] [place], Marzella Schlein, MD   1 year ago Obesity (BMI 35.0-39.9 without comorbidity)   [place] [facility] [place], [place], PA-C   1 year ago Encounter for annual physical exam   [place] [facility], Marzella Schlein, MD   2 years ago Left lower quadrant abdominal pain   [place] [facility], Lauren A, NP   2 years ago Obesity (BMI 30.0-34.9)    [facility] [place], Marzella Schlein, MD

## 2023-09-26 ENCOUNTER — Telehealth: Payer: Self-pay

## 2023-09-26 DIAGNOSIS — R7303 Prediabetes: Secondary | ICD-10-CM

## 2023-09-26 NOTE — Telephone Encounter (Signed)
Copied from CRM [id number]. Topic: General - Other >> Sep 26, 2023  9:08 AM Shelly Coleman wrote: Reason for CRM: The patient would like to be contacted when available to discuss their previously requested refill of tirzepatide [facility])  The patient would like to know if they'll need to attend their appt on 10/16/23 or can receive their medication sooner   Please contact the patient further when available

## 2023-09-27 NOTE — Telephone Encounter (Signed)
Ok to refill x 88m

## 2023-09-28 MED ORDER — TIRZEPATIDE 5 MG/0.5ML ~~LOC~~ SOAJ: 5.0000 mg | SUBCUTANEOUS | 0 refills | Status: DC

## 2023-09-30 ENCOUNTER — Other Ambulatory Visit: Payer: Self-pay | Admitting: Family Medicine

## 2023-10-01 NOTE — Telephone Encounter (Signed)
Requested Prescriptions  Pending Prescriptions Disp Refills   levothyroxine (SYNTHROID) 50 MCG tablet [Pharmacy Med Name: LEVOTHYROXINE 50 MCG TABLET] 90 tablet 0    Sig: TAKE 1 TABLET BY MOUTH EVERY DAY     Endocrinology:  Hypothyroid Agents Passed - 09/30/2023  8:46 AM      Passed - TSH in normal range and within 360 days    TSH  Date Value Ref Range Status  12/20/2022 1.160 0.450 - 4.500 uIU/mL Final         Passed - Valid encounter within last 12 months    Recent Outpatient Visits           9 months ago Encounter for annual physical exam   [place] [facility] [place], Marzella Schlein, MD   1 year ago Obesity (BMI 35.0-39.9 without comorbidity)   [place] [facility] [place], [place], PA-C   1 year ago Encounter for annual physical exam   [facility] [place], Marzella Schlein, MD   2 years ago Left lower quadrant abdominal pain   [place] [facility], Lauren A, NP   2 years ago Obesity (BMI 30.0-34.9)   [place] [facility] [place], Marzella Schlein, MD       Future Appointments             In 2 weeks Bacigalupo, Marzella Schlein, MD [facility], [facility]   In 2 months Bacigalupo, Marzella Schlein, MD [facility]

## 2023-10-16 ENCOUNTER — Ambulatory Visit: Payer: 59 | Admitting: Family Medicine

## 2023-10-16 ENCOUNTER — Encounter: Payer: Self-pay | Admitting: Family Medicine

## 2023-10-16 VITALS — BP 122/82 | HR 77 | Resp 16 | Ht 64.0 in | Wt 209.9 lb

## 2023-10-16 DIAGNOSIS — E669 Obesity, unspecified: Secondary | ICD-10-CM

## 2023-10-16 DIAGNOSIS — F411 Generalized anxiety disorder: Secondary | ICD-10-CM

## 2023-10-16 DIAGNOSIS — R7303 Prediabetes: Secondary | ICD-10-CM | POA: Diagnosis not present

## 2023-10-16 DIAGNOSIS — F321 Major depressive disorder, single episode, moderate: Secondary | ICD-10-CM

## 2023-10-16 DIAGNOSIS — Z23 Encounter for immunization: Secondary | ICD-10-CM

## 2023-10-16 DIAGNOSIS — Z3009 Encounter for other general counseling and advice on contraception: Secondary | ICD-10-CM

## 2023-10-16 DIAGNOSIS — Z6836 Body mass index (BMI) 36.0-36.9, adult: Secondary | ICD-10-CM

## 2023-10-16 LAB — POCT GLYCOSYLATED HEMOGLOBIN (HGB A1C)
Est. average glucose Bld gHb Est-mCnc: 120
Hemoglobin A1C: 5.8 % — AB (ref 4.0–5.6)

## 2023-10-16 MED ORDER — TIRZEPATIDE 5 MG/0.5ML ~~LOC~~ SOAJ: 5.0000 mg | SUBCUTANEOUS | 2 refills | Status: DC

## 2023-10-16 NOTE — Assessment & Plan Note (Signed)
Significant improvement in mental health, no current depression, reduced anxiety. Attending therapy sessions monthly or every 45 days. Feels they may not need therapy long-term. - Continue current therapy sessions as needed

## 2023-10-16 NOTE — Progress Notes (Signed)
Established Patient Office Visit  Subjective   Patient ID: Shelly Coleman, female    DOB: [birth year]  Age: 37 y.o. MRN: [id number]  Chief Complaint  Patient presents with   Medical Management of Chronic Issues    Weight management    HPI  Discussed the use of AI scribe software for clinical note transcription with the patient, who gave verbal consent to proceed.  History of Present Illness   The patient, who has been on [facility] since February, reports feeling better and less depressed. They have been working out regularly since September, aiming for at least four sessions a week. They report that they had a stressful period in May and June due to family pressures but are now feeling better. They have been seeing a therapist approximately once a month or once every 45 days and feel they are improving and may not need therapy in the future. The patient's A1c levels are checked during the visit, showing a decrease to 5.8. The patient also expresses interest in getting birth control, specifically an IUD, and has a discussion with the doctor about the different options and potential side effects.         12/25/2022    8:33 AM 05/24/2022    3:07 PM 11/24/2021    3:39 PM 12/27/2020   11:28 AM 09/06/2020    3:19 PM  Depression screen PHQ 2/9  Decreased Interest 1 1 3 1 2   Down, Depressed, Hopeless 1 1 3 1 3   PHQ - 2 Score 2 2 6 2 5   Altered sleeping 2 0 2 1 2   Tired, decreased energy 1 1 3 1 1   Change in appetite 1 1 2 1 1   Feeling bad or failure about yourself  2 2 2 2 1   Trouble concentrating 1 2 1 1 1   Moving slowly or fidgety/restless 0 0 1 0 1  Suicidal thoughts 0 0 1 0 0  PHQ-9 Score 9 8 18 8 12   Difficult doing work/chores Somewhat difficult Somewhat difficult Very difficult Very difficult Somewhat difficult         No data to display             ROS    Objective:     BP 122/82 (BP Location: Left Arm, Patient Position: Sitting, Cuff Size: Large)   Pulse 77    Resp 16   Ht 5\' 4"  (1.626 m)   Wt 209 lb 14.4 oz (95.2 kg)   BMI 36.03 kg/m    Physical Exam Vitals reviewed.  Constitutional:      General: She is not in acute distress.    Appearance: Normal appearance. She is well-developed. She is not diaphoretic.  HENT:     Head: Normocephalic and atraumatic.  Eyes:     General: No scleral icterus.    Conjunctiva/sclera: Conjunctivae normal.  Neck:     Thyroid: No thyromegaly.  Cardiovascular:     Rate and Rhythm: Normal rate and regular rhythm.     Heart sounds: Normal heart sounds. No murmur heard. Pulmonary:     Effort: Pulmonary effort is normal. No respiratory distress.     Breath sounds: Normal breath sounds. No wheezing, rhonchi or rales.  Musculoskeletal:     Cervical back: Neck supple.     Right lower leg: No edema.     Left lower leg: No edema.  Lymphadenopathy:     Cervical: No cervical adenopathy.  Skin:    General: Skin is warm and dry.  Findings: No rash.  Neurological:     Mental Status: She is alert and oriented to person, place, and time. Mental status is at baseline.  Psychiatric:        Mood and Affect: Mood normal.        Behavior: Behavior normal.      Results for orders placed or performed in visit on 10/16/23  POCT HgB A1C  Result Value Ref Range   Hemoglobin A1C 5.8 (A) 4.0 - 5.6 %   Est. average glucose Bld gHb Est-mCnc 120       The ASCVD Risk score (Arnett DK, et al., 2019) failed to calculate for the following reasons:   The 2019 ASCVD risk score is only valid for ages 76 to 55    Assessment & Plan:   Problem List Items Addressed This Visit       Other   Obesity (BMI 30-39.9)    Discussed importance of healthy weight management Discussed diet and exercise Continue mounjaro as above      Relevant Medications   tirzepatide (MOUNJARO) 5 MG/0.5ML Pen   Prediabetes - Primary    Prediabetes with an A1c of 5.8%, down from 6.1-6.2%. On Mounjaro 5 mg weekly since March, with a brief  interruption. Regular exercise since September has improved mental health and weight management. Discussed the importance of monitoring A1c for medication recertification and insurance purposes. - Check A1c today - Move physical to March to recheck A1c - Continue Mounjaro 5 mg weekly - Refill Mounjaro prescription      Relevant Medications   tirzepatide (MOUNJARO) 5 MG/0.5ML Pen   Other Relevant Orders   POCT HgB A1C (Completed)   Current moderate episode of major depressive disorder without prior episode (HCC)    Significant improvement in mental health, no current depression, reduced anxiety. Attending therapy sessions monthly or every 45 days. Feels they may not need therapy long-term. - Continue current therapy sessions as needed      GAD (generalized anxiety disorder)    Significant improvement in mental health, no current depression, reduced anxiety. Attending therapy sessions monthly or every 45 days. Feels they may not need therapy long-term. - Continue current therapy sessions as needed      Other Visit Diagnoses     Immunization due       Relevant Orders   Flu vaccine trivalent PF, 6mos and older(Flulaval,Afluria,Fluarix,Fluzone) (Completed)   Encounter for counseling regarding contraception       Relevant Orders   Ambulatory referral to Gynecology           Contraception Counseling Inquired about birth control options. Discussed barrier methods, pills, patches, depo shots, IUDs, and Nexplanon. Interested in IUDs and Nexplanon, with concerns about side effects and long-term health impacts. Recommended a progesterone IUD for its effectiveness and minimal systemic side effects. Explained that IUDs are as effective as tubal ligation and can reduce or eliminate periods. Discussed risks including rare uterine perforation and cramping during and after the procedure. Insurance coverage confirmed. - Refer to gynecologist for IUD placement - Discuss IUD options with  gynecologist  General Health Maintenance Received a flu shot during the visit. - Administer flu shot  Follow-up - Schedule physical exam in March - Recheck A1c in March.        Return in about 3 months (around 01/14/2024) for CPE.    Shirlee Latch, MD

## 2023-10-16 NOTE — Assessment & Plan Note (Signed)
Prediabetes with an A1c of 5.8%, down from 6.1-6.2%. On Mounjaro 5 mg weekly since March, with a brief interruption. Regular exercise since September has improved mental health and weight management. Discussed the importance of monitoring A1c for medication recertification and insurance purposes. - Check A1c today - Move physical to March to recheck A1c - Continue Mounjaro 5 mg weekly - Refill Mounjaro prescription

## 2023-10-16 NOTE — Assessment & Plan Note (Signed)
Discussed importance of healthy weight management Discussed diet and exercise Continue mounjaro as above

## 2023-12-07 ENCOUNTER — Ambulatory Visit: Payer: 59 | Admitting: Certified Nurse Midwife

## 2023-12-07 VITALS — BP 119/79 | HR 61 | Ht 64.0 in | Wt 209.4 lb

## 2023-12-07 DIAGNOSIS — Z3009 Encounter for other general counseling and advice on contraception: Secondary | ICD-10-CM | POA: Diagnosis not present

## 2023-12-07 DIAGNOSIS — Z3202 Encounter for pregnancy test, result negative: Secondary | ICD-10-CM

## 2023-12-07 LAB — POCT URINE PREGNANCY: Preg Test, Ur: NEGATIVE

## 2023-12-07 NOTE — Progress Notes (Signed)
Subjective:    Shelly Coleman is a 38 y.o. female who presents for contraception counseling. The patient has no complaints today. The patient is sexually active. Pertinent past medical history: none.  Menstrual History: OB History     Gravida  1   Para  1   Term  1   Preterm      AB      Living  1      SAB      IAB      Ectopic      Multiple  0   Live Births  1            Patient's last menstrual period was 11/20/2023 (exact date). Period Cycle (Days): 30 Period Duration (Days): 5 Period Pattern: Regular Menstrual Flow: Heavy (HEAVY first 2 days) Dysmenorrhea: None  The following portions of the patient's history were reviewed and updated as appropriate: allergies, current medications, past family history, past medical history, past social history, past surgical history, and problem list.  Review of Systems Pertinent items are noted in HPI.   Objective:    No exam performed today,  not indicated to discussed birth control options .   Assessment:    38 y.o., considering IUD/nexplanon no contraindications.   Plan:    All questions answered.Reviewed all forms of birth control options available including  fertility period awareness methods; over the counter/barrier methods; hormonal contraceptive medication including pill, patch, ring, injection,contraceptive implant; hormonal and nonhormonal IUDs; permanent sterilization options including vasectomy and the various tubal sterilization modalities.Given she is 38 years old recommend use of progestin only method. Risks and benefits reviewed.  Questions were answered.  Information was given to patient to review.  She is considering Mirena IUD vs nexplanon. Discussed both procedures. Will order percocet for her prior to IUD placement if she decides to move forward with IUD. Pt will let me know via my chart .  Doreene Burke, CNM

## 2023-12-27 ENCOUNTER — Encounter: Payer: Self-pay | Admitting: Family Medicine

## 2023-12-27 ENCOUNTER — Other Ambulatory Visit: Payer: Self-pay | Admitting: Family Medicine

## 2023-12-27 NOTE — Telephone Encounter (Signed)
Requested Prescriptions  Pending Prescriptions Disp Refills   levothyroxine (SYNTHROID) 50 MCG tablet [Pharmacy Med Name: LEVOTHYROXINE 50 MCG TABLET] 90 tablet 0    Sig: TAKE 1 TABLET BY MOUTH EVERY DAY     Endocrinology:  Hypothyroid Agents Failed - 12/27/2023  1:54 PM      Failed - TSH in normal range and within 360 days    TSH  Date Value Ref Range Status  12/20/2022 1.160 0.450 - 4.500 uIU/mL Final         Passed - Valid encounter within last 12 months    Recent Outpatient Visits           2 months ago Prediabetes   [place] [facility] [place], Marzella Schlein, MD   1 year ago Encounter for annual physical exam   [place] [facility] [place], Marzella Schlein, MD   1 year ago Obesity (BMI 35.0-39.9 without comorbidity)   [place] [facility] [place], [place], PA-C   2 years ago Encounter for annual physical exam   [place] [facility] [place], Marzella Schlein, MD   2 years ago Left lower quadrant abdominal pain   [place] [facility] [place], NP       Future Appointments             In 4 weeks Bacigalupo, Marzella Schlein, MD [facility], [facility]

## 2024-01-24 ENCOUNTER — Encounter: Payer: Self-pay | Admitting: Family Medicine

## 2024-03-07 ENCOUNTER — Encounter: Payer: 59 | Admitting: Family Medicine

## 2024-04-01 ENCOUNTER — Encounter: Admitting: Family Medicine

## 2024-04-04 ENCOUNTER — Other Ambulatory Visit: Payer: Self-pay | Admitting: Family Medicine

## 2024-05-05 ENCOUNTER — Encounter: Payer: Self-pay | Admitting: Family Medicine

## 2024-05-05 ENCOUNTER — Ambulatory Visit (INDEPENDENT_AMBULATORY_CARE_PROVIDER_SITE_OTHER): Admitting: Family Medicine

## 2024-05-05 VITALS — BP 137/82 | HR 78 | Ht 64.0 in | Wt 210.0 lb

## 2024-05-05 DIAGNOSIS — E538 Deficiency of other specified B group vitamins: Secondary | ICD-10-CM

## 2024-05-05 DIAGNOSIS — F321 Major depressive disorder, single episode, moderate: Secondary | ICD-10-CM

## 2024-05-05 DIAGNOSIS — F411 Generalized anxiety disorder: Secondary | ICD-10-CM

## 2024-05-05 DIAGNOSIS — R7303 Prediabetes: Secondary | ICD-10-CM

## 2024-05-05 DIAGNOSIS — E063 Autoimmune thyroiditis: Secondary | ICD-10-CM

## 2024-05-05 DIAGNOSIS — Z0000 Encounter for general adult medical examination without abnormal findings: Secondary | ICD-10-CM | POA: Diagnosis not present

## 2024-05-05 DIAGNOSIS — E559 Vitamin D deficiency, unspecified: Secondary | ICD-10-CM

## 2024-05-05 NOTE — Assessment & Plan Note (Signed)
 Previously well controlled Continue Synthroid at current dose  Recheck TSH and adjust Synthroid as indicated

## 2024-05-05 NOTE — Assessment & Plan Note (Signed)
 Significant improvement in mental health, no current depression, reduced anxiety.

## 2024-05-05 NOTE — Progress Notes (Signed)
 Complete physical exam   Patient: Shelly Coleman   DOB: [birth year]   38 y.o. Female  MRN: [id number] Visit Date: 05/05/2024  Today's healthcare provider: Jon Eva, MD   Chief Complaint  Patient presents with   Annual Exam   Care Management    Pattern of eating: General (no red meat)  Are you exercising: Burn [facility]  How long: 45  How frequent: 4 to 5 times a week   Vaccine: Agree    Subjective    Shelly Coleman is a 38 y.o. female who presents today for a complete physical exam.   Discussed the use of AI scribe software for clinical note transcription with the patient, who gave verbal consent to proceed.  History of Present Illness   Shelly Coleman is a 38 year old female who presents for an annual physical exam.  She takes Synthroid  50 mcg daily and Mounjaro  5 mg, which she administers every 10 to 15 days instead of weekly. She engages in regular workouts, feeling more toned with increased energy and endurance, despite no significant weight loss. She plans to discontinue Mounjaro  within the year.  In January, she sustained an injury that prevented gym attendance for six to eight weeks, attributed to flat feet and sciatic nerve issues, requiring chiropractic care. Standing was problematic, but sitting was comfortable. She has resumed her fitness routine, aiming to maintain fitness and noting improved energy levels.  Her last A1c in December was 5.8, down from 6.2 in September when she started Mounjaro . She is concerned about her weight not decreasing despite feeling healthier and more toned.  She reports improved mood and reduced anxiety, attributing this to her workout routine, which she describes as therapeutic.  She has normal Pap smears and negative HPV tests. She is considering the HPV vaccine but is undecided, noting her low risk for cervical cancer due to her monogamous relationship and normal Pap history.        Last depression screening scores     05/05/2024    3:48 PM 12/25/2022    8:33 AM 05/24/2022    3:07 PM  PHQ 2/9 Scores  PHQ - 2 Score 1 2 2   PHQ- 9 Score 2 9 8    Last fall risk screening    12/25/2022    8:30 AM  Fall Risk   Falls in the past year? 0  Number falls in past yr: 0  Injury with Fall? 0  Risk for fall due to : No Fall Risks  Follow up Falls evaluation completed        Medications: Outpatient Medications Prior to Visit  Medication Sig   levothyroxine  (SYNTHROID ) 50 MCG tablet TAKE 1 TABLET BY MOUTH EVERY DAY   Multiple Vitamin (MULTIVITAMIN WITH MINERALS) TABS tablet Take 1 tablet by mouth 2 (two) times a week.   tirzepatide  (MOUNJARO ) 5 MG/0.5ML Pen Inject 5 mg into the skin once a week.   No facility-administered medications prior to visit.    Review of Systems    Objective    BP 137/82   Pulse 78   Ht 5' 4 (1.626 m)   Wt 210 lb (95.3 kg)   SpO2 100%   BMI 36.05 kg/m    Physical Exam Vitals reviewed.  Constitutional:      General: She is not in acute distress.    Appearance: Normal appearance. She is well-developed. She is not diaphoretic.  HENT:     Head: Normocephalic and atraumatic.  Right Ear: Tympanic membrane, ear canal and external ear normal.     Left Ear: Tympanic membrane, ear canal and external ear normal.     Nose: Nose normal.     Mouth/Throat:     Mouth: Mucous membranes are moist.     Pharynx: Oropharynx is clear. No oropharyngeal exudate.   Eyes:     General: No scleral icterus.    Conjunctiva/sclera: Conjunctivae normal.     Pupils: Pupils are equal, round, and reactive to light.   Neck:     Thyroid: No thyromegaly.   Cardiovascular:     Rate and Rhythm: Normal rate and regular rhythm.     Heart sounds: Normal heart sounds. No murmur heard. Pulmonary:     Effort: Pulmonary effort is normal. No respiratory distress.     Breath sounds: Normal breath sounds. No wheezing or rales.  Abdominal:     General: There is no distension.     Palpations:  Abdomen is soft.     Tenderness: There is no abdominal tenderness.   Musculoskeletal:        General: No deformity.     Cervical back: Neck supple.     Right lower leg: No edema.     Left lower leg: No edema.  Lymphadenopathy:     Cervical: No cervical adenopathy.   Skin:    General: Skin is warm and dry.     Findings: No rash.   Neurological:     Mental Status: She is alert and oriented to person, place, and time. Mental status is at baseline.     Gait: Gait normal.   Psychiatric:        Mood and Affect: Mood normal.        Behavior: Behavior normal.        Thought Content: Thought content normal.      No results found for any visits on 05/05/24.  Assessment & Plan    Routine Health Maintenance and Physical Exam  Exercise Activities and Dietary recommendations  Goals   None     Immunization History  Administered Date(s) Administered   Influenza Inj Mdck Quad Pf 08/12/2018   Influenza, Seasonal, Injecte, Preservative Fre 10/16/2023   Influenza,inj,Quad PF,6+ Mos 10/09/2017, 08/14/2019, 12/25/2022   Influenza-Unspecified 10/13/2021   Moderna Sars-Covid-2 Vaccination 10/04/2020   Tdap 01/17/2018    Health Maintenance  Topic Date Due   HPV VACCINES (1 - 3-dose SCDM series) Never done   COVID-19 Vaccine (2 - Moderna risk series) 11/01/2020   INFLUENZA VACCINE  06/13/2024   Cervical Cancer Screening (Pap smear)  12/26/2027   Cervical Cancer Screening (HPV/Pap Cotest)  12/26/2027   DTaP/Tdap/Td (2 - Td or Tdap) 01/18/2028   Hepatitis C Screening  Completed   HIV Screening  Completed   Meningococcal B Vaccine  Aged Out    Discussed health benefits of physical activity, and encouraged her to engage in regular exercise appropriate for her age and condition.  Problem List Items Addressed This Visit       Endocrine   Hypothyroidism due to Hashimoto's thyroiditis   Previously well controlled Continue Synthroid  at current dose  Recheck TSH and adjust Synthroid   as indicated        Relevant Orders   CBC w/Diff/Platelet   TSH     Other   Vitamin D  deficiency   Relevant Orders   VITAMIN D  25 Hydroxy (Vit-D Deficiency, Fractures)   Vitamin B12 deficiency   Relevant Orders   B12  Prediabetes   recent HbA1c of 5.8% in December, improved from 6.2%. Currently on Mounjaro  5 mg weekly, but taking it every 10-15 days. No significant weight loss, but improved energy and endurance. She aims to discontinue Mounjaro  within the year. Discussed increasing the dose for weight loss, which would prolong therapy. Prefers to achieve weight loss goal before tapering off medication. - Order HbA1c, kidney and liver function tests, cholesterol, blood counts, thyroid, vitamin D , and B12 levels. - Instruct her to take Mounjaro  5 mg weekly. - Reassess Mounjaro  dosage based on lab results and weight loss goals. - Set a follow-up appointment in six months, with the option to adjust sooner if needed.      Relevant Orders   Comprehensive metabolic panel with GFR   Lipid panel   Hemoglobin A1c   Current moderate episode of major depressive disorder without prior episode (HCC)   Significant improvement in mental health, no current depression, reduced anxiety.       GAD (generalized anxiety disorder)   Significant improvement in mental health, no current depression, reduced anxiety.       Other Visit Diagnoses       Encounter for annual physical exam    -  Primary   Relevant Orders   Comprehensive metabolic panel with GFR   Lipid panel   Hemoglobin A1c   CBC w/Diff/Platelet   B12   VITAMIN D  25 Hydroxy (Vit-D Deficiency, Fractures)   TSH           Sciatica Sciatica associated with flat feet. Previously required chiropractic care. Currently asymptomatic with regular exercise.  General Health Maintenance Discussion about HPV vaccination. She is low risk for HPV due to monogamous relationship and normal Pap smear history. HPV vaccine is a three-shot  series, and she is considering whether to pursue it. The vaccine is typically recommended for high-risk individuals aged 17-45. - Consider HPV vaccination and call back with decision.        Return in about 6 months (around 11/04/2024) for chronic disease f/u.     Jon Eva, MD  [facility] [phone] (phone) [phone] (fax)  [facility]

## 2024-05-05 NOTE — Assessment & Plan Note (Signed)
 recent HbA1c of 5.8% in December, improved from 6.2%. Currently on Mounjaro  5 mg weekly, but taking it every 10-15 days. No significant weight loss, but improved energy and endurance. She aims to discontinue Mounjaro  within the year. Discussed increasing the dose for weight loss, which would prolong therapy. Prefers to achieve weight loss goal before tapering off medication. - Order HbA1c, kidney and liver function tests, cholesterol, blood counts, thyroid, vitamin D , and B12 levels. - Instruct her to take Mounjaro  5 mg weekly. - Reassess Mounjaro  dosage based on lab results and weight loss goals. - Set a follow-up appointment in six months, with the option to adjust sooner if needed.

## 2024-05-06 ENCOUNTER — Ambulatory Visit: Payer: Self-pay | Admitting: Family Medicine

## 2024-05-06 LAB — CBC WITH DIFFERENTIAL/PLATELET
Basophils Absolute: 0 10*3/uL (ref 0.0–0.2)
Basos: 0 %
EOS (ABSOLUTE): 0.1 10*3/uL (ref 0.0–0.4)
Eos: 2 %
Hematocrit: 37.7 % (ref 34.0–46.6)
Hemoglobin: 12.2 g/dL (ref 11.1–15.9)
Immature Grans (Abs): 0 10*3/uL (ref 0.0–0.1)
Immature Granulocytes: 0 %
Lymphocytes Absolute: 3.1 10*3/uL (ref 0.7–3.1)
Lymphs: 34 %
MCH: 27.2 pg (ref 26.6–33.0)
MCHC: 32.4 g/dL (ref 31.5–35.7)
MCV: 84 fL (ref 79–97)
Monocytes Absolute: 0.5 10*3/uL (ref 0.1–0.9)
Monocytes: 6 %
Neutrophils Absolute: 5.3 10*3/uL (ref 1.4–7.0)
Neutrophils: 58 %
Platelets: 365 10*3/uL (ref 150–450)
RBC: 4.49 x10E6/uL (ref 3.77–5.28)
RDW: 13.4 % (ref 11.7–15.4)
WBC: 9.1 10*3/uL (ref 3.4–10.8)

## 2024-05-06 LAB — COMPREHENSIVE METABOLIC PANEL WITH GFR
ALT: 20 IU/L (ref 0–32)
AST: 19 IU/L (ref 0–40)
Albumin: 4.1 g/dL (ref 3.9–4.9)
Alkaline Phosphatase: 61 IU/L (ref 44–121)
BUN/Creatinine Ratio: 19 (ref 9–23)
BUN: 13 mg/dL (ref 6–20)
Bilirubin Total: <0.2 mg/dL (ref 0.0–1.2)
CO2: 20 mmol/L (ref 20–29)
Calcium: 9.3 mg/dL (ref 8.7–10.2)
Chloride: 102 mmol/L (ref 96–106)
Creatinine, Ser: 0.67 mg/dL (ref 0.57–1.00)
Globulin, Total: 2.7 g/dL (ref 1.5–4.5)
Glucose: 88 mg/dL (ref 70–99)
Potassium: 4.7 mmol/L (ref 3.5–5.2)
Sodium: 138 mmol/L (ref 134–144)
Total Protein: 6.8 g/dL (ref 6.0–8.5)
eGFR: 114 mL/min/{1.73_m2} (ref >59)

## 2024-05-06 LAB — HEMOGLOBIN A1C
Est. average glucose Bld gHb Est-mCnc: 117 mg/dL
Hgb A1c MFr Bld: 5.7 % — ABNORMAL HIGH (ref 4.8–5.6)

## 2024-05-06 LAB — LIPID PANEL
Chol/HDL Ratio: 4.7 ratio — ABNORMAL HIGH (ref 0.0–4.4)
Cholesterol, Total: 213 mg/dL — ABNORMAL HIGH (ref 100–199)
HDL: 45 mg/dL (ref >39)
LDL Chol Calc (NIH): 139 mg/dL — ABNORMAL HIGH (ref 0–99)
Triglycerides: 161 mg/dL — ABNORMAL HIGH (ref 0–149)
VLDL Cholesterol Cal: 29 mg/dL (ref 5–40)

## 2024-05-06 LAB — TSH: TSH: 1.87 u[IU]/mL (ref 0.450–4.500)

## 2024-05-06 LAB — VITAMIN D 25 HYDROXY (VIT D DEFICIENCY, FRACTURES): Vit D, 25-Hydroxy: 25.3 ng/mL — ABNORMAL LOW (ref 30.0–100.0)

## 2024-05-06 LAB — VITAMIN B12: Vitamin B-12: 609 pg/mL (ref 232–1245)

## 2024-05-08 ENCOUNTER — Telehealth: Payer: Self-pay

## 2024-05-08 NOTE — Telephone Encounter (Signed)
 Copied from CRM [id number]. Topic: General - Other >> May 08, 2024 10:47 AM Cleave MATSU wrote: Reason for CRM: [employer] is calling to see if we received fax that was sent over on yesterday.  Please follow up [phone]

## 2024-05-09 ENCOUNTER — Other Ambulatory Visit: Payer: Self-pay

## 2024-05-09 DIAGNOSIS — R7303 Prediabetes: Secondary | ICD-10-CM

## 2024-05-09 NOTE — Telephone Encounter (Signed)
 Based on lab results patient would like to stay on the same dose for both medications for the next 6 months as long as you are ok with that. She knows it won't be sent in until Monday after you decide

## 2024-05-09 NOTE — Telephone Encounter (Signed)
 Called [employer] back and they stated that the case is already closed and to disregard this message nothing else is needed at this time

## 2024-05-12 MED ORDER — TIRZEPATIDE 5 MG/0.5ML ~~LOC~~ SOAJ: 5.0000 mg | SUBCUTANEOUS | 2 refills | Status: AC

## 2024-05-12 MED ORDER — LEVOTHYROXINE SODIUM 50 MCG PO TABS: 50.0000 ug | ORAL_TABLET | Freq: Every day | ORAL | 3 refills | Status: AC

## 2024-11-17 ENCOUNTER — Ambulatory Visit: Admitting: Family Medicine

## 2024-12-25 ENCOUNTER — Ambulatory Visit: Admitting: Family Medicine
